# Patient Record
Sex: Male | Born: 1950 | Race: White | Hispanic: No | Marital: Married | Smoking: Former smoker
Health system: Southern US, Community
[De-identification: ages and names within clinical notes are randomized; demographics above are authoritative.]

## PROBLEM LIST (undated history)

## (undated) DIAGNOSIS — R42 Dizziness and giddiness: Secondary | ICD-10-CM

## (undated) DIAGNOSIS — J449 Chronic obstructive pulmonary disease, unspecified: Secondary | ICD-10-CM

## (undated) DIAGNOSIS — E039 Hypothyroidism, unspecified: Secondary | ICD-10-CM

## (undated) DIAGNOSIS — E079 Disorder of thyroid, unspecified: Secondary | ICD-10-CM

## (undated) DIAGNOSIS — I10 Essential (primary) hypertension: Secondary | ICD-10-CM

## (undated) DIAGNOSIS — R0600 Dyspnea, unspecified: Secondary | ICD-10-CM

## (undated) DIAGNOSIS — M1990 Unspecified osteoarthritis, unspecified site: Secondary | ICD-10-CM

## (undated) DIAGNOSIS — N189 Chronic kidney disease, unspecified: Secondary | ICD-10-CM

## (undated) HISTORY — DX: Disorder of thyroid, unspecified: E07.9

## (undated) HISTORY — DX: Dizziness and giddiness: R42

## (undated) HISTORY — PX: OTHER SURGICAL HISTORY: SHX169

---

## 2013-06-19 DIAGNOSIS — M5430 Sciatica, unspecified side: Secondary | ICD-10-CM | POA: Diagnosis not present

## 2013-06-19 DIAGNOSIS — M999 Biomechanical lesion, unspecified: Secondary | ICD-10-CM | POA: Diagnosis not present

## 2013-06-19 DIAGNOSIS — M47817 Spondylosis without myelopathy or radiculopathy, lumbosacral region: Secondary | ICD-10-CM | POA: Diagnosis not present

## 2013-06-23 DIAGNOSIS — M47817 Spondylosis without myelopathy or radiculopathy, lumbosacral region: Secondary | ICD-10-CM | POA: Diagnosis not present

## 2013-06-23 DIAGNOSIS — M999 Biomechanical lesion, unspecified: Secondary | ICD-10-CM | POA: Diagnosis not present

## 2013-06-23 DIAGNOSIS — M5430 Sciatica, unspecified side: Secondary | ICD-10-CM | POA: Diagnosis not present

## 2013-06-26 DIAGNOSIS — M999 Biomechanical lesion, unspecified: Secondary | ICD-10-CM | POA: Diagnosis not present

## 2013-06-26 DIAGNOSIS — M47817 Spondylosis without myelopathy or radiculopathy, lumbosacral region: Secondary | ICD-10-CM | POA: Diagnosis not present

## 2013-06-26 DIAGNOSIS — M5430 Sciatica, unspecified side: Secondary | ICD-10-CM | POA: Diagnosis not present

## 2013-06-30 DIAGNOSIS — M999 Biomechanical lesion, unspecified: Secondary | ICD-10-CM | POA: Diagnosis not present

## 2013-06-30 DIAGNOSIS — M5430 Sciatica, unspecified side: Secondary | ICD-10-CM | POA: Diagnosis not present

## 2013-06-30 DIAGNOSIS — M47817 Spondylosis without myelopathy or radiculopathy, lumbosacral region: Secondary | ICD-10-CM | POA: Diagnosis not present

## 2013-07-08 DIAGNOSIS — M999 Biomechanical lesion, unspecified: Secondary | ICD-10-CM | POA: Diagnosis not present

## 2013-07-08 DIAGNOSIS — M5430 Sciatica, unspecified side: Secondary | ICD-10-CM | POA: Diagnosis not present

## 2013-07-08 DIAGNOSIS — M47817 Spondylosis without myelopathy or radiculopathy, lumbosacral region: Secondary | ICD-10-CM | POA: Diagnosis not present

## 2013-07-15 DIAGNOSIS — M47817 Spondylosis without myelopathy or radiculopathy, lumbosacral region: Secondary | ICD-10-CM | POA: Diagnosis not present

## 2013-07-15 DIAGNOSIS — M999 Biomechanical lesion, unspecified: Secondary | ICD-10-CM | POA: Diagnosis not present

## 2013-07-15 DIAGNOSIS — M5430 Sciatica, unspecified side: Secondary | ICD-10-CM | POA: Diagnosis not present

## 2013-08-05 DIAGNOSIS — M999 Biomechanical lesion, unspecified: Secondary | ICD-10-CM | POA: Diagnosis not present

## 2013-08-05 DIAGNOSIS — M5430 Sciatica, unspecified side: Secondary | ICD-10-CM | POA: Diagnosis not present

## 2013-08-05 DIAGNOSIS — M47817 Spondylosis without myelopathy or radiculopathy, lumbosacral region: Secondary | ICD-10-CM | POA: Diagnosis not present

## 2013-09-17 DIAGNOSIS — M9981 Other biomechanical lesions of cervical region: Secondary | ICD-10-CM | POA: Diagnosis not present

## 2013-09-17 DIAGNOSIS — M999 Biomechanical lesion, unspecified: Secondary | ICD-10-CM | POA: Diagnosis not present

## 2013-09-17 DIAGNOSIS — M47817 Spondylosis without myelopathy or radiculopathy, lumbosacral region: Secondary | ICD-10-CM | POA: Diagnosis not present

## 2013-09-17 DIAGNOSIS — M4712 Other spondylosis with myelopathy, cervical region: Secondary | ICD-10-CM | POA: Diagnosis not present

## 2013-09-17 DIAGNOSIS — M546 Pain in thoracic spine: Secondary | ICD-10-CM | POA: Diagnosis not present

## 2013-10-15 DIAGNOSIS — E7800 Pure hypercholesterolemia, unspecified: Secondary | ICD-10-CM | POA: Diagnosis not present

## 2014-01-15 DIAGNOSIS — I10 Essential (primary) hypertension: Secondary | ICD-10-CM | POA: Diagnosis not present

## 2014-01-20 DIAGNOSIS — R739 Hyperglycemia, unspecified: Secondary | ICD-10-CM | POA: Diagnosis not present

## 2014-01-20 DIAGNOSIS — Z23 Encounter for immunization: Secondary | ICD-10-CM | POA: Diagnosis not present

## 2014-01-20 DIAGNOSIS — M1990 Unspecified osteoarthritis, unspecified site: Secondary | ICD-10-CM | POA: Diagnosis not present

## 2014-01-20 DIAGNOSIS — Z6827 Body mass index (BMI) 27.0-27.9, adult: Secondary | ICD-10-CM | POA: Diagnosis not present

## 2014-01-20 DIAGNOSIS — E780 Pure hypercholesterolemia: Secondary | ICD-10-CM | POA: Diagnosis not present

## 2014-01-20 DIAGNOSIS — I10 Essential (primary) hypertension: Secondary | ICD-10-CM | POA: Diagnosis not present

## 2014-07-15 DIAGNOSIS — E780 Pure hypercholesterolemia: Secondary | ICD-10-CM | POA: Diagnosis not present

## 2014-07-15 DIAGNOSIS — I10 Essential (primary) hypertension: Secondary | ICD-10-CM | POA: Diagnosis not present

## 2014-07-15 DIAGNOSIS — R739 Hyperglycemia, unspecified: Secondary | ICD-10-CM | POA: Diagnosis not present

## 2014-07-15 DIAGNOSIS — M1000 Idiopathic gout, unspecified site: Secondary | ICD-10-CM | POA: Diagnosis not present

## 2014-07-15 DIAGNOSIS — M1990 Unspecified osteoarthritis, unspecified site: Secondary | ICD-10-CM | POA: Diagnosis not present

## 2014-07-21 DIAGNOSIS — M1000 Idiopathic gout, unspecified site: Secondary | ICD-10-CM | POA: Diagnosis not present

## 2014-07-21 DIAGNOSIS — E780 Pure hypercholesterolemia: Secondary | ICD-10-CM | POA: Diagnosis not present

## 2014-07-21 DIAGNOSIS — I10 Essential (primary) hypertension: Secondary | ICD-10-CM | POA: Diagnosis not present

## 2014-10-23 DIAGNOSIS — M25562 Pain in left knee: Secondary | ICD-10-CM | POA: Diagnosis not present

## 2014-10-23 DIAGNOSIS — M25462 Effusion, left knee: Secondary | ICD-10-CM | POA: Diagnosis not present

## 2014-11-02 DIAGNOSIS — M25462 Effusion, left knee: Secondary | ICD-10-CM | POA: Diagnosis not present

## 2014-11-02 DIAGNOSIS — M25562 Pain in left knee: Secondary | ICD-10-CM | POA: Diagnosis not present

## 2014-11-30 DIAGNOSIS — M25562 Pain in left knee: Secondary | ICD-10-CM | POA: Diagnosis not present

## 2014-11-30 DIAGNOSIS — M25462 Effusion, left knee: Secondary | ICD-10-CM | POA: Diagnosis not present

## 2015-01-13 DIAGNOSIS — E780 Pure hypercholesterolemia: Secondary | ICD-10-CM | POA: Diagnosis not present

## 2015-01-13 DIAGNOSIS — I10 Essential (primary) hypertension: Secondary | ICD-10-CM | POA: Diagnosis not present

## 2015-01-13 DIAGNOSIS — R739 Hyperglycemia, unspecified: Secondary | ICD-10-CM | POA: Diagnosis not present

## 2015-01-18 DIAGNOSIS — M25562 Pain in left knee: Secondary | ICD-10-CM | POA: Diagnosis not present

## 2015-01-18 DIAGNOSIS — M25462 Effusion, left knee: Secondary | ICD-10-CM | POA: Diagnosis not present

## 2015-01-26 DIAGNOSIS — L821 Other seborrheic keratosis: Secondary | ICD-10-CM | POA: Diagnosis not present

## 2015-01-26 DIAGNOSIS — L570 Actinic keratosis: Secondary | ICD-10-CM | POA: Diagnosis not present

## 2015-02-19 DIAGNOSIS — M948X6 Other specified disorders of cartilage, lower leg: Secondary | ICD-10-CM | POA: Diagnosis not present

## 2015-02-19 DIAGNOSIS — M25462 Effusion, left knee: Secondary | ICD-10-CM | POA: Diagnosis not present

## 2015-02-19 DIAGNOSIS — S83282A Other tear of lateral meniscus, current injury, left knee, initial encounter: Secondary | ICD-10-CM | POA: Diagnosis not present

## 2015-02-19 DIAGNOSIS — M25562 Pain in left knee: Secondary | ICD-10-CM | POA: Diagnosis not present

## 2015-02-22 DIAGNOSIS — M25562 Pain in left knee: Secondary | ICD-10-CM | POA: Diagnosis not present

## 2015-05-26 DIAGNOSIS — E7800 Pure hypercholesterolemia, unspecified: Secondary | ICD-10-CM | POA: Diagnosis not present

## 2015-05-26 DIAGNOSIS — R739 Hyperglycemia, unspecified: Secondary | ICD-10-CM | POA: Diagnosis not present

## 2015-05-26 DIAGNOSIS — I10 Essential (primary) hypertension: Secondary | ICD-10-CM | POA: Diagnosis not present

## 2015-06-09 DIAGNOSIS — Z79899 Other long term (current) drug therapy: Secondary | ICD-10-CM | POA: Diagnosis not present

## 2015-06-09 DIAGNOSIS — E785 Hyperlipidemia, unspecified: Secondary | ICD-10-CM | POA: Diagnosis not present

## 2015-06-09 DIAGNOSIS — Z7982 Long term (current) use of aspirin: Secondary | ICD-10-CM | POA: Diagnosis not present

## 2015-06-09 DIAGNOSIS — M1712 Unilateral primary osteoarthritis, left knee: Secondary | ICD-10-CM | POA: Diagnosis not present

## 2015-06-09 DIAGNOSIS — I10 Essential (primary) hypertension: Secondary | ICD-10-CM | POA: Diagnosis not present

## 2015-06-09 DIAGNOSIS — M25562 Pain in left knee: Secondary | ICD-10-CM | POA: Diagnosis not present

## 2015-07-16 DIAGNOSIS — M1712 Unilateral primary osteoarthritis, left knee: Secondary | ICD-10-CM | POA: Diagnosis not present

## 2015-07-16 DIAGNOSIS — M25562 Pain in left knee: Secondary | ICD-10-CM | POA: Diagnosis not present

## 2015-11-11 DIAGNOSIS — E7800 Pure hypercholesterolemia, unspecified: Secondary | ICD-10-CM | POA: Diagnosis not present

## 2015-11-11 DIAGNOSIS — Z6827 Body mass index (BMI) 27.0-27.9, adult: Secondary | ICD-10-CM | POA: Diagnosis not present

## 2015-11-11 DIAGNOSIS — I10 Essential (primary) hypertension: Secondary | ICD-10-CM | POA: Diagnosis not present

## 2015-11-11 DIAGNOSIS — R0600 Dyspnea, unspecified: Secondary | ICD-10-CM | POA: Diagnosis not present

## 2015-12-02 DIAGNOSIS — R0600 Dyspnea, unspecified: Secondary | ICD-10-CM | POA: Diagnosis not present

## 2015-12-02 DIAGNOSIS — Z6826 Body mass index (BMI) 26.0-26.9, adult: Secondary | ICD-10-CM | POA: Diagnosis not present

## 2015-12-02 DIAGNOSIS — R079 Chest pain, unspecified: Secondary | ICD-10-CM | POA: Diagnosis not present

## 2015-12-02 DIAGNOSIS — I10 Essential (primary) hypertension: Secondary | ICD-10-CM | POA: Diagnosis not present

## 2015-12-07 DIAGNOSIS — R931 Abnormal findings on diagnostic imaging of heart and coronary circulation: Secondary | ICD-10-CM | POA: Diagnosis not present

## 2015-12-07 DIAGNOSIS — R0602 Shortness of breath: Secondary | ICD-10-CM | POA: Diagnosis not present

## 2015-12-07 DIAGNOSIS — R079 Chest pain, unspecified: Secondary | ICD-10-CM | POA: Diagnosis not present

## 2016-02-23 DIAGNOSIS — L821 Other seborrheic keratosis: Secondary | ICD-10-CM | POA: Diagnosis not present

## 2016-02-23 DIAGNOSIS — L570 Actinic keratosis: Secondary | ICD-10-CM | POA: Diagnosis not present

## 2016-02-23 DIAGNOSIS — D485 Neoplasm of uncertain behavior of skin: Secondary | ICD-10-CM | POA: Diagnosis not present

## 2016-03-03 DIAGNOSIS — N185 Chronic kidney disease, stage 5: Secondary | ICD-10-CM | POA: Diagnosis not present

## 2016-03-06 DIAGNOSIS — I10 Essential (primary) hypertension: Secondary | ICD-10-CM | POA: Diagnosis not present

## 2016-03-06 DIAGNOSIS — N183 Chronic kidney disease, stage 3 (moderate): Secondary | ICD-10-CM | POA: Diagnosis not present

## 2016-03-06 DIAGNOSIS — D631 Anemia in chronic kidney disease: Secondary | ICD-10-CM | POA: Diagnosis not present

## 2016-03-06 DIAGNOSIS — N2581 Secondary hyperparathyroidism of renal origin: Secondary | ICD-10-CM | POA: Diagnosis not present

## 2016-03-06 DIAGNOSIS — M109 Gout, unspecified: Secondary | ICD-10-CM | POA: Diagnosis not present

## 2016-03-14 DIAGNOSIS — N184 Chronic kidney disease, stage 4 (severe): Secondary | ICD-10-CM | POA: Diagnosis not present

## 2016-03-14 DIAGNOSIS — I10 Essential (primary) hypertension: Secondary | ICD-10-CM | POA: Diagnosis not present

## 2016-03-14 DIAGNOSIS — D631 Anemia in chronic kidney disease: Secondary | ICD-10-CM | POA: Diagnosis not present

## 2016-03-14 DIAGNOSIS — N2581 Secondary hyperparathyroidism of renal origin: Secondary | ICD-10-CM | POA: Diagnosis not present

## 2016-05-05 DIAGNOSIS — N2581 Secondary hyperparathyroidism of renal origin: Secondary | ICD-10-CM | POA: Diagnosis not present

## 2016-05-05 DIAGNOSIS — N184 Chronic kidney disease, stage 4 (severe): Secondary | ICD-10-CM | POA: Diagnosis not present

## 2016-05-12 DIAGNOSIS — I10 Essential (primary) hypertension: Secondary | ICD-10-CM | POA: Diagnosis not present

## 2016-05-12 DIAGNOSIS — H8113 Benign paroxysmal vertigo, bilateral: Secondary | ICD-10-CM | POA: Diagnosis not present

## 2016-05-12 DIAGNOSIS — D631 Anemia in chronic kidney disease: Secondary | ICD-10-CM | POA: Diagnosis not present

## 2016-05-12 DIAGNOSIS — N184 Chronic kidney disease, stage 4 (severe): Secondary | ICD-10-CM | POA: Diagnosis not present

## 2016-05-12 DIAGNOSIS — N2581 Secondary hyperparathyroidism of renal origin: Secondary | ICD-10-CM | POA: Diagnosis not present

## 2016-05-15 ENCOUNTER — Other Ambulatory Visit: Payer: Self-pay | Admitting: Nephrology

## 2016-05-15 DIAGNOSIS — N184 Chronic kidney disease, stage 4 (severe): Secondary | ICD-10-CM

## 2016-05-24 ENCOUNTER — Ambulatory Visit
Admission: RE | Admit: 2016-05-24 | Discharge: 2016-05-24 | Disposition: A | Discharge status: Home / Self Care | Payer: BLUE CROSS/BLUE SHIELD | Source: Ambulatory Visit | Attending: Nephrology | Admitting: Nephrology

## 2016-05-24 DIAGNOSIS — N184 Chronic kidney disease, stage 4 (severe): Secondary | ICD-10-CM

## 2016-05-24 DIAGNOSIS — I129 Hypertensive chronic kidney disease with stage 1 through stage 4 chronic kidney disease, or unspecified chronic kidney disease: Secondary | ICD-10-CM | POA: Diagnosis not present

## 2016-06-05 ENCOUNTER — Other Ambulatory Visit: Payer: Self-pay | Admitting: Nephrology

## 2016-06-05 DIAGNOSIS — I701 Atherosclerosis of renal artery: Secondary | ICD-10-CM

## 2016-06-08 ENCOUNTER — Ambulatory Visit (HOSPITAL_COMMUNITY): Payer: BLUE CROSS/BLUE SHIELD | Attending: Nephrology | Admitting: Physical Therapy

## 2016-06-08 DIAGNOSIS — H8111 Benign paroxysmal vertigo, right ear: Secondary | ICD-10-CM | POA: Insufficient documentation

## 2016-06-08 NOTE — Therapy (Addendum)
[place], [place], [address] Phone: [phone]   Fax:  [phone]  Physical Therapy Evaluation  Patient Details  Name: Marcus Herrera MRN: [id number] Date of Birth: 07/31/1950 Referring Provider: Elmarie Shiley   Encounter Date: 06/08/2016      PT End of Session - 06/08/16 1539    Visit Number 1   Number of Visits 8   Date for PT Re-Evaluation 07/08/16   Authorization Type BCBS   Authorization - Visit Number 1   Authorization - Number of Visits 8   PT Start Time 4268   PT Stop Time 3419   PT Time Calculation (min) 44 min   Activity Tolerance Patient tolerated treatment well   Behavior During Therapy [facility] for tasks assessed/performed      No past medical history on file.  No past surgical history on file.  There were no vitals filed for this visit.       Subjective Assessment - 06/08/16 1303    Subjective Marcus Herrera states that he ahd been experiencing episodes of dizziness whenever he looks up, as well as when he comes from sit to stand.  He states that the dizziness lasts from 2 to 30 minutes.      How long can you sit comfortably? no problem   How long can you stand comfortably? no problem    How long can you walk comfortably? no problem    Currently in Pain? No/denies            [facility] PT Assessment - 06/08/16 0001      Assessment   Medical Diagnosis BPPV   Referring Provider Elmarie Shiley    Onset Date/Surgical Date 11/30/15   Next MD Visit not scheduled    Prior Therapy none     Precautions   Precautions None     Restrictions   Weight Bearing Restrictions No     Balance Screen   Has the patient fallen in the past 6 months No   Has the patient had a decrease in activity level because of a fear of falling?  No   Is the patient reluctant to leave their home because of a fear of falling?  No     Prior Function   Level of Independence Independent     Cognition   Overall Cognitive Status  Within Functional Limits for tasks assessed            Vestibular Assessment - 06/08/16 0001      Vestibular Assessment   General Observation normal     Symptom Behavior   Type of Dizziness Imbalance   Frequency of Dizziness 5-10    Duration of Dizziness 2-30 minutes    Aggravating Factors Lying supine;Sit to stand   Relieving Factors Head stationary     Occulomotor Exam   Smooth Pursuits Saccades  Lt eye to the right strong; Left mild    Saccades Poor trajectory  to the left      Vestibulo-Occular Reflex   VOR 1 Head Only (x 1 viewing) negative    VOR 2 Head and Object (x 2 viewing) --  mild symptoms      Positional Testing   Dix-Hallpike Dix-Hallpike Right;Dix-Hallpike Left     Dix-Hallpike Right   Dix-Hallpike Right Duration --  20 seconds   Dix-Hallpike Right Symptoms Left nystagmus     Dix-Hallpike Left   Dix-Hallpike Left Duration --  mild symptoms    Dix-Hallpike Left Symptoms  No nystagmus                Vestibular Treatment/Exercise - 06/08/16 0001      Vestibular Treatment/Exercise   Vestibular Treatment Provided Canalith Repositioning;Gaze   Canalith Repositioning Epley Manuever Right   Gaze Exercises X1 Viewing Horizontal;X1 Viewing Vertical;X2 Viewing Horizontal;X2 Viewing Vertical      EPLEY MANUEVER RIGHT   Number of Reps  2   Overall Response Improved Symptoms   Response Details  less vertigo with second manuever      X1 Viewing Horizontal   Foot Position floor   Reps 5     X1 Viewing Vertical   Reps 5     X2 Viewing Horizontal   Reps 5     X2 Viewing Vertical   Reps 5               PT Education - 06/08/16 1539    Education provided Yes   Education Details eye exercises and VORI    Person(s) Educated Patient   Methods Explanation;Handout   Comprehension Verbalized understanding          PT Short Term Goals - 06/08/16 1545      PT SHORT TERM GOAL #1   Title Pt to verbalize that he is experiencing his  symptoms 5x a day or less to decrease risk of fall    Time 2   Period Weeks   Status New     PT SHORT TERM GOAL #2   Title Pt to states that he is able to come sit to stand without having any symptoms of dizziness.    Time 2   Period Weeks   Status New           PT Long Term Goals - 06/08/16 1547      PT LONG TERM GOAL #1   Title Pt to state that he has not had symptoms of dizziness for the past week    Time 4   Period Weeks   Status New     PT LONG TERM GOAL #2   Title PT to be able to look up at items at [facility] without experiencing any dizziness    Time 4   Period Weeks   Status New               Plan - 06/08/16 1540    Clinical Impression Statement Marcus Herrera is a 66 yo male who has been referred to skilled physical therapy for BPPV.  He states that his symptoms began six months ago and are progressive in nature.  PT has a positive Rt Hal-pike Dix test, as well as positve saccades.  Marcus Herrera will benefit from [facility], as well as habituation and high balance actitvity to decrease his sx of vertigo to decrease his risk of falling.    Rehab Potential Good   PT Frequency 2x / week   PT Duration 4 weeks   PT Treatment/Interventions ADLs/Self Care Home Management;Canalith Repostioning;Therapeutic exercise;Balance training;Patient/family education   PT Next Visit Plan give pt self cannalith repositioning sheet.  Complete eye motion and head turns in front of mural.  NBOS with head turns while on foam and repeat Eply's manuever .    PT Home Exercise Plan eye exercises, head turns    Consulted and Agree with Plan of Care Patient      [phone] CJ  Patient will benefit from skilled therapeutic intervention in order to improve the following deficits and  impairments:  Dizziness  Visit Diagnosis: BPPV (benign paroxysmal positional vertigo), right - Plan: PT plan of care cert/re-cert     Problem List There are no active problems to display for this  patient.   Rayetta Humphrey, PT CLT [phone] 06/08/2016, 3:52 PM  [place] [address] [place], [place], [address] Phone: [phone]   Fax:  [phone]  Name: Marcus Herrera MRN: [id number] Date of Birth: 1951/03/26

## 2016-06-13 ENCOUNTER — Ambulatory Visit (HOSPITAL_COMMUNITY): Payer: BLUE CROSS/BLUE SHIELD

## 2016-06-13 DIAGNOSIS — H8111 Benign paroxysmal vertigo, right ear: Secondary | ICD-10-CM | POA: Diagnosis not present

## 2016-06-13 NOTE — Therapy (Signed)
[place], [place], [address] Phone: [phone]   Fax:  [phone]  Physical Therapy Treatment  Patient Details  Name: Kenric Ginger MRN: [id number] Date of Birth: [birth year] Referring Provider: Elmarie Shiley   Encounter Date: 06/13/2016      PT End of Session - 06/13/16 0956    Visit Number 2   Number of Visits 8   Date for PT Re-Evaluation 07/08/16   Authorization Type BCBS   Authorization - Visit Number 2   Authorization - Number of Visits 8   PT Start Time [phone]   PT Stop Time 1030   PT Time Calculation (min) 42 min   Activity Tolerance Patient tolerated treatment well   Behavior During Therapy [facility] for tasks assessed/performed      No past medical history on file.  No past surgical history on file.  There were no vitals filed for this visit.      Subjective Assessment - 06/13/16 0939    Subjective Pt stated he had a dizzy episode at the grocery store following last session and some dizziness as he looks up.  Reports complaince with HEP without questions concerning exercises.   Currently in Pain? No/denies                Vestibular Assessment - 06/13/16 0001      Vestibular Assessment   General Observation normal     Occulomotor Exam   Smooth Pursuits Saccades   Saccades Poor trajectory     Positional Testing   Dix-Hallpike Dix-Hallpike Right  2 sets     Dix-Hallpike Right   Dix-Hallpike Right Symptoms Left nystagmus                  Vestibular Treatment/Exercise - 06/13/16 0001      Vestibular Treatment/Exercise   Vestibular Treatment Provided Canalith Repositioning;Gaze   Canalith Repositioning Epley Manuever Right      EPLEY MANUEVER RIGHT   Number of Reps  2   Overall Response Improved Symptoms   Response Details  less vertigo with second manuever      X1 Viewing Horizontal   Foot Position NBOS on foam   Reps 10     X1 Viewing Vertical   Foot Position  NBOS on foam   Reps 10   Comments Increased dizziness with looking up, increased dizziness with reps     X2 Viewing Horizontal   Foot Position NBOS on foam   Reps 10     X2 Viewing Vertical   Foot Position NBOS on foam   Reps 10   Comments Increased dizziness with looking up, increased dizziness with reps                 PT Short Term Goals - 06/08/16 1545      PT SHORT TERM GOAL #1   Title Pt to verbalize that he is experiencing his symptoms 5x a day or less to decrease risk of fall    Time 2   Period Weeks   Status New     PT SHORT TERM GOAL #2   Title Pt to states that he is able to come sit to stand without having any symptoms of dizziness.    Time 2   Period Weeks   Status New           PT Long Term Goals - 06/08/16 1547      PT LONG TERM GOAL #1   Title  Pt to state that he has not had symptoms of dizziness for the past week    Time 4   Period Weeks   Status New     PT LONG TERM GOAL #2   Title PT to be able to look up at items at [facility] without experiencing any dizziness    Time 4   Period Weeks   Status New               Plan - 06/13/16 1256    Clinical Impression Statement Reviewed goals, assured complaince and appropriate form/technique with HEP and copy of eval given to pt.  Began session with Eply's manever with reports of reduced dizziness intensity following.  Continued with eye gaze therex and progressed to NBOS balance activities on dynamic surface with min A required for safety on dynamic surface.     Rehab Potential Good   PT Frequency 2x / week   PT Duration 4 weeks   PT Treatment/Interventions ADLs/Self Care Home Management;Canalith Repostioning;Therapeutic exercise;Balance training;Patient/family education   PT Next Visit Plan give pt self cannalith repositioning sheet.  Complete eye motion and head turns in front of mural.  NBOS with head turns while on foam and repeat Eply's manuever .       Patient will benefit from  skilled therapeutic intervention in order to improve the following deficits and impairments:  Dizziness  Visit Diagnosis: BPPV (benign paroxysmal positional vertigo), right     Problem List There are no active problems to display for this patient.  [address], LPTA; [place]  Aldona Lento 06/13/2016, 1:07 PM  [place] [address] [place], [place], [address] Phone: [phone]   Fax:  [phone]  Name: Tajon Moring MRN: [id number] Date of Birth: 03/11/1951

## 2016-06-14 ENCOUNTER — Other Ambulatory Visit (HOSPITAL_COMMUNITY): Payer: Self-pay | Admitting: Interventional Radiology

## 2016-06-14 ENCOUNTER — Ambulatory Visit
Admission: RE | Admit: 2016-06-14 | Discharge: 2016-06-14 | Disposition: A | Discharge status: Home / Self Care | Payer: BLUE CROSS/BLUE SHIELD | Source: Ambulatory Visit | Attending: Nephrology | Admitting: Nephrology

## 2016-06-14 DIAGNOSIS — I701 Atherosclerosis of renal artery: Secondary | ICD-10-CM | POA: Diagnosis not present

## 2016-06-14 HISTORY — DX: Chronic obstructive pulmonary disease, unspecified: J44.9

## 2016-06-14 HISTORY — DX: Essential (primary) hypertension: I10

## 2016-06-14 HISTORY — DX: Chronic kidney disease, unspecified: N18.9

## 2016-06-14 HISTORY — PX: IR GENERIC HISTORICAL: IMG1180011

## 2016-06-14 NOTE — Consult Note (Signed)
Chief Complaint: Patient was seen in consultation today for renal arteriography at the request of Patel,Jay  Referring Physician(s): Patel,Jay  History of Present Illness: Marcus Herrera is a 66 y.o. male with a history of chronic kidney disease, stage IV. He had a progressive decline in renal function in 2017 with creatinine as high as 3.1. Creatinine is now around 2.4. Renal ultrasound demonstrated a smaller right kidney compared to the left. Additional renal duplex ultrasound on 05/24/2016 demonstrated elevated velocities at the level of the proximal to mid left renal artery. Although renal artery ratio at this level was not higher than 3.5, there was significant discrepancy in velocity measurements between the right and left sides.  [name] is currently undergoing rehabilitation treatment for benign positional vertigo.  Past Medical History:  Diagnosis Date  . Chronic kidney disease   . COPD (chronic obstructive pulmonary disease) ([place])   . Hypertension     No past surgical history on file.  Allergies: Sulfa antibiotics  Medications: Prior to Admission medications   Medication Sig Start Date End Date Taking? Authorizing Provider  aspirin 81 MG tablet Take 81 mg by mouth daily.   Yes Historical Provider, MD  atorvastatin (LIPITOR) 10 MG tablet Take 10 mg by mouth daily.   Yes Historical Provider, MD  cholecalciferol (VITAMIN D) 1000 units tablet Take 1,000 Units by mouth daily.   Yes Historical Provider, MD  doxycycline (ADOXA) 50 MG tablet Take 50 mg by mouth daily.   Yes Historical Provider, MD  gemfibrozil (LOPID) 600 MG tablet Take 600 mg by mouth daily.   Yes Historical Provider, MD  losartan-hydrochlorothiazide (HYZAAR) 100-12.5 MG tablet Take 1 tablet by mouth daily. Take 1/2 tablet per day.   Yes Historical Provider, MD  Omega-3 Fatty Acids (FISH OIL PO) Take 1 capsule by mouth daily.   Yes Historical Provider, MD     No family history on file.  Social History    Social History  . Marital status: Married    Spouse name: N/A  . Number of children: N/A  . Years of education: N/A   Social History Main Topics  . Smoking status: Former Research scientist (life sciences)  . Smokeless tobacco: Never Used  . Alcohol use 4.2 - 6.0 oz/week    7 - 10 Cans of beer per week  . Drug use: No  . Sexual activity: Not on file   Other Topics Concern  . Not on file   Social History Narrative  . No narrative on file     Review of Systems: A 12 point ROS discussed and pertinent positives are indicated in the HPI above.  All other systems are negative.  Review of Systems  Constitutional: Negative.   HENT: Negative.   Respiratory: Negative.   Cardiovascular: Negative.   Gastrointestinal: Negative.   Genitourinary: Negative.   Musculoskeletal: Negative.   Neurological:       Vertigo when looking up for prolonged periods of time.    Vital Signs: BP 137/61 (BP Location: Left Arm, Patient Position: Sitting, Cuff Size: Normal)   Pulse 70   Temp 97.8 F (36.6 C) (Oral)   Resp 14   Ht 6' (1.829 m)   Wt 195 lb (88.5 kg)   SpO2 100%   BMI 26.45 kg/m   Physical Exam  Constitutional: He is oriented to person, place, and time. He appears well-developed and well-nourished. No distress.  HENT:  Head: Normocephalic and atraumatic.  Neck: Neck supple. No JVD present.  Cardiovascular: Normal rate,  regular rhythm and normal heart sounds.  Exam reveals no gallop and no friction rub.   No murmur heard. Pulmonary/Chest: Effort normal and breath sounds normal. No respiratory distress. He has no wheezes. He has no rales.  Abdominal: Soft. Bowel sounds are normal. He exhibits no distension and no mass. There is no tenderness. There is no rebound and no guarding.  No audible bruits.  Musculoskeletal: He exhibits no edema.  Neurological: He is alert and oriented to person, place, and time.  Skin: He is not diaphoretic.  Nursing note and vitals reviewed.   Imaging: US Renal  Result  Date: 05/24/2016 CLINICAL DATA:  Hypertension, chronic kidney disease stage 4 EXAM: RENAL/URINARY TRACT ULTRASOUND RENAL DUPLEX DOPPLER ULTRASOUND COMPARISON:  None. FINDINGS: Right Kidney: Length: 8 cm. Echogenicity within normal limits. 3 mm shadowing focus in the mid renal collecting system. No mass or hydronephrosis visualized. Left Kidney: Length: 11.2 cm. Echogenicity within normal limits. No mass or hydronephrosis visualized. Bladder:  Unremarkable. Prostatic enlargement 4.6 x 3.4 x 5 cm. RENAL DUPLEX ULTRASOUND Right Renal Artery Velocities: Origin:  70 cm/sec Mid:  100 cm/sec Hilum:  51 cm/sec Interlobar:  25 cm/sec Arcuate:  15 Cm/sec Left Renal Artery Velocities: Origin:  226 cm/sec Mid:  280 cm/sec Hilum:  136 cm/sec Interlobar:  50 cm/sec Arcuate:  26 cm/sec Aortic Velocity:  112 Cm/sec Right Renal-Aortic Ratios: Origin: 0.6 Mid:  0.9 Hilum: 0.5 Interlobar: 0.2 Arcuate: 0.1 Left Renal-Aortic Ratios: Origin: 2.0 Mid: 2.5 Hilum: 1.2 Interlobar: 0.4 Arcuate: 0.2 IMPRESSION: 1. Elevated peak systolic velocities in the proximal left renal artery suggesting hemodynamically significant stenosis. Consider catheter angiography for further evaluation, as the lesion may be amenable to percutaneous treatment if confirmed to be significant. 2. Right renal parenchymal atrophy. Electronically Signed   By: Lucrezia Europe M.D.   On: 05/24/2016 10:45   US Renal Artery Stenosis  Result Date: 05/24/2016 CLINICAL DATA:  Hypertension, chronic kidney disease stage 4 EXAM: RENAL/URINARY TRACT ULTRASOUND RENAL DUPLEX DOPPLER ULTRASOUND COMPARISON:  None. FINDINGS: Right Kidney: Length: 8 cm. Echogenicity within normal limits. 3 mm shadowing focus in the mid renal collecting system. No mass or hydronephrosis visualized. Left Kidney: Length: 11.2 cm. Echogenicity within normal limits. No mass or hydronephrosis visualized. Bladder:  Unremarkable. Prostatic enlargement 4.6 x 3.4 x 5 cm. RENAL DUPLEX ULTRASOUND Right Renal Artery  Velocities: Origin:  70 cm/sec Mid:  100 cm/sec Hilum:  51 cm/sec Interlobar:  25 cm/sec Arcuate:  15 Cm/sec Left Renal Artery Velocities: Origin:  226 cm/sec Mid:  280 cm/sec Hilum:  136 cm/sec Interlobar:  50 cm/sec Arcuate:  26 cm/sec Aortic Velocity:  112 Cm/sec Right Renal-Aortic Ratios: Origin: 0.6 Mid:  0.9 Hilum: 0.5 Interlobar: 0.2 Arcuate: 0.1 Left Renal-Aortic Ratios: Origin: 2.0 Mid: 2.5 Hilum: 1.2 Interlobar: 0.4 Arcuate: 0.2 IMPRESSION: 1. Elevated peak systolic velocities in the proximal left renal artery suggesting hemodynamically significant stenosis. Consider catheter angiography for further evaluation, as the lesion may be amenable to percutaneous treatment if confirmed to be significant. 2. Right renal parenchymal atrophy. Electronically Signed   By: Lucrezia Europe M.D.   On: 05/24/2016 10:45    Assessment and Plan:  I met with Mr. Guerreiro and discussed performing renal arteriography with possible intervention due to the duplex ultrasound findings suggestive of potential underlying left renal artery stenosis. We reviewed duplex ultrasound findings. 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  This may be at the level of the renal parenchyma, however, and not the main renal artery segment. 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 If this is uncovered with CO2 arteriography and determined to be hemodynamically significant, this would be potentially treatable with placement of an intravascular stent. I told Mr. Moultrie that treatment of a significant main renal artery segment stenosis may or may not have an impact on progression of chronic kidney disease. However, there may still be some long-term benefit in reducing the risk of progressive atrophy of the left kidney if a significant left renal artery  stenosis is uncovered by arteriography. I did make him aware that renal function could transiently worsened after stent placement due to microembolization to intraparenchymal branches after stent placement due to plaque rupture.  Details and risks of arteriography and possible stent placement were discussed with the patient. After discussion, the patient would like to proceed. We will schedule the procedure at [facility] and tentatively it appears that the procedure will be scheduled on Monday, March 12.  Thank you for this interesting consult.  I greatly enjoyed meeting [facility] and look forward to participating in their care.  A copy of this report was sent to the requesting provider on this date.  Electronically SignedAletta Edouard T 06/14/2016, 9:47 AM   I spent a total of 40 Minutes in face to face in clinical consultation, greater than 50% of which was counseling/coordinating care for renal arteriography and possible stent placement to treat renal artery stenosis.

## 2016-06-15 ENCOUNTER — Ambulatory Visit (HOSPITAL_COMMUNITY): Payer: BLUE CROSS/BLUE SHIELD | Attending: Nephrology

## 2016-06-15 DIAGNOSIS — H8111 Benign paroxysmal vertigo, right ear: Secondary | ICD-10-CM | POA: Insufficient documentation

## 2016-06-15 NOTE — Therapy (Signed)
[place], [place], [address] Phone: [phone]   Fax:  [phone]  Physical Therapy Treatment  Patient Details  Name: Tramayne Sebesta MRN: [id number] Date of Birth: [birth year] Referring Provider: Elmarie Shiley   Encounter Date: 06/15/2016      PT End of Session - 06/15/16 1129    Visit Number 3   Number of Visits 8   Date for PT Re-Evaluation 07/08/16   Authorization Type BCBS   Authorization - Visit Number 3   Authorization - Number of Visits 8   PT Start Time 1119   PT Stop Time 6606   PT Time Calculation (min) 38 min   Equipment Utilized During Treatment Gait belt   Activity Tolerance Patient tolerated treatment well;No increased pain   Behavior During Therapy WFL for tasks assessed/performed      Past Medical History:  Diagnosis Date  . Chronic kidney disease   . COPD (chronic obstructive pulmonary disease) ([place])   . Hypertension     No past surgical history on file.  There were no vitals filed for this visit.      Subjective Assessment - 06/15/16 1122    Subjective Pt stated he has had less dizzy episodes but has not looked up as much lately.  Reports compliance wiht HEP 2x daily.     Currently in Pain? No/denies                          Vestibular Treatment/Exercise - 06/15/16 0001      Vestibular Treatment/Exercise   Vestibular Treatment Provided Canalith Repositioning;Gaze   Canalith Repositioning Epley Manuever Right   Gaze Exercises X2 Viewing Horizontal;X2 Viewing Vertical;Eye/Head Exercise Vertical      EPLEY MANUEVER RIGHT   Number of Reps  3  last epley's manuever self    Overall Response Improved Symptoms   Response Details  less vertigo with second manuever             Balance Exercises - 06/15/16 1203      Balance Exercises: Standing   Standing Eyes Opened Narrow base of support (BOS);Head turns  eye gaze and head movements on foam NBOS 10x each  direction   Tandem Stance Eyes open;2 reps;30 secs  with eye gaze activities             PT Short Term Goals - 06/08/16 1545      PT SHORT TERM GOAL #1   Title Pt to verbalize that he is experiencing his symptoms 5x a day or less to decrease risk of fall    Time 2   Period Weeks   Status New     PT SHORT TERM GOAL #2   Title Pt to states that he is able to come sit to stand without having any symptoms of dizziness.    Time 2   Period Weeks   Status New           PT Long Term Goals - 06/08/16 1547      PT LONG TERM GOAL #1   Title Pt to state that he has not had symptoms of dizziness for the past week    Time 4   Period Weeks   Status New     PT LONG TERM GOAL #2   Title PT to be able to look up at items at [facility] without experiencing any dizziness    Time 4   Period Weeks  Status New               Plan - 06/15/16 1204    Clinical Impression Statement Began session with Epley's manuever, instructed and pt given handout for self cannalith repositioning sheet.  Pt able to verbalize and demonstrate appropriate form and technqiue with no questions pertaining.  Reports of decreased dizzines with each set.  Ended session with NBOS balance activities with min A required for safety to reduce risk of fall.     Rehab Potential Good   PT Frequency 2x / week   PT Duration 4 weeks   PT Next Visit Plan Next session review compliance and answer any questions iwht self cannalith repositioning. Complete eye motion and head turns in front of mural.  NBOS with head turns while on foam and repeat Eply's manuever .    PT Home Exercise Plan eye exercises, head turns       Patient will benefit from skilled therapeutic intervention in order to improve the following deficits and impairments:  Dizziness  Visit Diagnosis: BPPV (benign paroxysmal positional vertigo), right     Problem List There are no active problems to display for this patient.  [address], LPTA;  [place]  Aldona Lento 06/15/2016, 12:09 PM  [place] [address] [place], [place], [address] Phone: [phone]   Fax:  [phone]  Name: Makena Mcgrady MRN: [id number] Date of Birth: 05-Mar-1951

## 2016-06-20 ENCOUNTER — Ambulatory Visit (HOSPITAL_COMMUNITY): Payer: BLUE CROSS/BLUE SHIELD

## 2016-06-20 DIAGNOSIS — H8111 Benign paroxysmal vertigo, right ear: Secondary | ICD-10-CM

## 2016-06-20 NOTE — Therapy (Signed)
[place], [place], [address] Phone: [phone]   Fax:  [phone]  Physical Therapy Treatment  Patient Details  Name: Marcus Herrera MRN: [id number] Date of Birth: [birth year] Referring Provider: Elmarie Shiley   Encounter Date: 06/20/2016      PT End of Session - 06/20/16 0820    Visit Number 4   Number of Visits 8   Date for PT Re-Evaluation 07/08/16   Authorization Type BCBS   PT Start Time 0816   PT Stop Time 0858   PT Time Calculation (min) 42 min   Equipment Utilized During Treatment Gait belt   Activity Tolerance Patient tolerated treatment well;No increased pain   Behavior During Therapy WFL for tasks assessed/performed      Past Medical History:  Diagnosis Date  . Chronic kidney disease   . COPD (chronic obstructive pulmonary disease) ([place])   . Hypertension     No past surgical history on file.  There were no vitals filed for this visit.      Subjective Assessment - 06/20/16 0818    Subjective Pt reports compliance with self tx and reports decreased of overall dizzyness.  Reports difficulty with balance.   Currently in Pain? No/denies                          Vestibular Treatment/Exercise - 06/20/16 0001      X2 Viewing Horizontal   Foot Position NBOS on foam   Reps 10     X2 Viewing Vertical   Foot Position NBOS on foam   Reps 10   Comments Increased dizziness with looking up, increased dizziness with reps     Eye/Head Exercise Vertical   Foot Position NBOS on airex   Reps 10            Balance Exercises - 06/20/16 0834      Balance Exercises: Standing   Standing Eyes Opened Narrow base of support (BOS);Head turns   Tandem Stance Eyes open;Foam/compliant surface;3 reps;30 secs  busy room   SLS 5 reps   SLS with Vectors Solid surface;3 reps;Intermittent upper extremity assist  3x5" with intermittent HHA   Balance Beam tandem 3 RT; 2 sets iwth head turns    Gait with Head Turns Forward;2 reps  2 RT around dept with looking up/down and rotation   Tandem Gait --  tandem 3 RT; 2 sets iwth head turns   Numbers 1-15 Foam/compliant surface;1 rep  too easy, D/C further sessions             PT Short Term Goals - 06/08/16 1545      PT SHORT TERM GOAL #1   Title Pt to verbalize that he is experiencing his symptoms 5x a day or less to decrease risk of fall    Time 2   Period Weeks   Status New     PT SHORT TERM GOAL #2   Title Pt to states that he is able to come sit to stand without having any symptoms of dizziness.    Time 2   Period Weeks   Status New           PT Long Term Goals - 06/08/16 1547      PT LONG TERM GOAL #1   Title Pt to state that he has not had symptoms of dizziness for the past week    Time 4   Period Weeks  Status New     PT LONG TERM GOAL #2   Title PT to be able to look up at items at [facility] without experiencing any dizziness    Time 4   Period Weeks   Status New               Plan - 06/20/16 5465    Clinical Impression Statement Pt reports independence with self cannalith repositioning at home and compliance with gazing exercises.  Session focus on balance activities as pt arrived without symptoms today and reoprts overall reduction wiht vertigo.  Progressed balance activities with gaze exercises infront of mural on dynamic surface in static position initially then incorporated with gait activities.  Min A required for LOB episodes for safety.  Most difficulty noted with static tandem stance.   Rehab Potential Good   PT Frequency 2x / week   PT Duration 4 weeks   PT Treatment/Interventions ADLs/Self Care Home Management;Canalith Repostioning;Therapeutic exercise;Balance training;Patient/family education   PT Next Visit Plan Continue session focus on balance activities with dynamic surface in front of mural.  Next session begin cone rotation in tandem stance on foam.        Patient will  benefit from skilled therapeutic intervention in order to improve the following deficits and impairments:  Dizziness  Visit Diagnosis: BPPV (benign paroxysmal positional vertigo), right     Problem List There are no active problems to display for this patient.  [address] LPTA; [place]  Aldona Lento 06/20/2016, 9:48 AM  [place], [place], [address] Phone: [phone]   Fax:  [phone]  Name: Garrie Woodin MRN: [id number] Date of Birth: 1950/08/03

## 2016-06-20 NOTE — Patient Instructions (Signed)
SINGLE LIMB STANCE    Stance: single leg on floor. Raise leg. Hold 30-60 seconds. Repeat with other leg. 5 reps per set,  Copyright  VHI. All rights reserved.   Tandem Stance    Right foot in front of left, heel touching toe both feet "straight ahead". Stand on Foot Triangle of Support with both feet. Balance in this position 30 seconds. Do with left foot in front of right.  Copyright  VHI. All rights reserved.

## 2016-06-23 ENCOUNTER — Other Ambulatory Visit: Payer: Self-pay | Admitting: General Surgery

## 2016-06-23 ENCOUNTER — Other Ambulatory Visit: Payer: Self-pay | Admitting: Physician Assistant

## 2016-06-23 ENCOUNTER — Encounter: Payer: Self-pay | Admitting: Interventional Radiology

## 2016-06-23 ENCOUNTER — Ambulatory Visit (HOSPITAL_COMMUNITY): Payer: BLUE CROSS/BLUE SHIELD

## 2016-06-23 DIAGNOSIS — H8111 Benign paroxysmal vertigo, right ear: Secondary | ICD-10-CM | POA: Diagnosis not present

## 2016-06-23 NOTE — Therapy (Signed)
[place] [address] [place], [place], [address] Phone: [phone]   Fax:  [phone]  Physical Therapy Treatment  Patient Details  Name: Marcus Herrera MRN: [id number] Date of Birth: December 06, 1950 Referring Provider: Elmarie Shiley   Encounter Date: 06/23/2016      PT End of Session - 06/23/16 0915    Visit Number 5   Number of Visits 8   Date for PT Re-Evaluation 07/08/16   Authorization Type BCBS   Authorization - Visit Number 5   Authorization - Number of Visits 8   PT Start Time 0816   PT Stop Time 0903   PT Time Calculation (min) 47 min   Equipment Utilized During Treatment Gait belt   Activity Tolerance Patient tolerated treatment well  c/o Lt hip catching following tandem stance on airex   Behavior During Therapy [facility] for tasks assessed/performed      Past Medical History:  Diagnosis Date  . Chronic kidney disease   . COPD (chronic obstructive pulmonary disease) ([place])   . Hypertension     Past Surgical History:  Procedure Laterality Date  . IR GENERIC HISTORICAL  06/14/2016   IR RADIOLOGIST EVAL & MGMT 06/14/2016 Aletta Edouard, MD [facility] INTERV RAD    There were no vitals filed for this visit.      Subjective Assessment - 06/23/16 0814    Subjective Pt reports complaince wtih self tx and reportsdecrease in overall dizziness.  Stated increased ease with balance activiteis at home.     Currently in Pain? No/denies              Balance Exercises - 06/23/16 0820      Balance Exercises: Standing   Tandem Stance Eyes open;Foam/compliant surface;3 reps;30 secs  last set with head turns   SLS 5 reps  Lt 11", Rt 51"   SLS with Vectors Solid surface;3 reps;Intermittent upper extremity assist;10 secs   Rebounder Foam/compliant surface;Tandem;20 reps   Balance Master: Limits for Stability Test: 3:46   Balance Master: Dynamic Test stability index 7.1; A34%, B55%, C 11%, D 0   Balance Beam tandem and retro 2RT   Gait with Head Turns Forward;2 reps  DGI   Sidestepping 2 reps;Theraband;Head turns  BTB   Cone Rotation Foam/compliant surface;A/P;R/L  tandem stance on Airex             PT Short Term Goals - 06/08/16 1545      PT SHORT TERM GOAL #1   Title Pt to verbalize that he is experiencing his symptoms 5x a day or less to decrease risk of fall    Time 2   Period Weeks   Status New     PT SHORT TERM GOAL #2   Title Pt to states that he is able to come sit to stand without having any symptoms of dizziness.    Time 2   Period Weeks   Status New           PT Long Term Goals - 06/08/16 1547      PT LONG TERM GOAL #1   Title Pt to state that he has not had symptoms of dizziness for the past week    Time 4   Period Weeks   Status New     PT LONG TERM GOAL #2   Title PT to be able to look up at items at [facility] without experiencing any dizziness    Time 4   Period Weeks  Status New               Plan - 06/23/16 4562    Clinical Impression Statement Pt progressing well with reports of decreased vertigo and compliance with self cannalith repositioning at home as well as balance HEP.  Continued session focus on balance activities with increased focus on static position on solid and dynamic surface.  Pt progressing well with min guard required only, does continue to have difficulty with Lt SLS activities.  Added sidestepping with resistance for glut med strengthening, began cone rotation in tandem on foam and testing complete on balance master.  Pt did c/o some Lt hip catching during balance activties, no pain scale given and reports ability to tolerate session.     Rehab Potential Good   PT Frequency 2x / week   PT Duration 4 weeks   PT Treatment/Interventions ADLs/Self Care Home Management;Canalith Repostioning;Therapeutic exercise;Balance training;Patient/family education   PT Next Visit Plan Continue SLS activities, tandem stance on foam with head/UE movements, begin  hurdles next session with SLS hold times.      Patient will benefit from skilled therapeutic intervention in order to improve the following deficits and impairments:  Dizziness  Visit Diagnosis: BPPV (benign paroxysmal positional vertigo), right     Problem List There are no active problems to display for this patient.  [address], LPTA; [place] Aldona Lento 06/23/2016, 9:28 AM  [place] [address] [place], [place], [address] Phone: [phone]   Fax:  [phone]  Name: Burley Kopka MRN: [id number] Date of Birth: [birth year]

## 2016-06-25 ENCOUNTER — Other Ambulatory Visit: Payer: Self-pay | Admitting: Radiology

## 2016-06-26 ENCOUNTER — Observation Stay (HOSPITAL_COMMUNITY)
Admission: RE | Admit: 2016-06-26 | Discharge: 2016-06-27 | Disposition: A | Discharge status: Home / Self Care | Payer: BLUE CROSS/BLUE SHIELD | Source: Ambulatory Visit | Attending: Interventional Radiology | Admitting: Interventional Radiology

## 2016-06-26 ENCOUNTER — Encounter (HOSPITAL_COMMUNITY): Payer: Self-pay | Admitting: Interventional Radiology

## 2016-06-26 ENCOUNTER — Other Ambulatory Visit (HOSPITAL_COMMUNITY): Payer: Self-pay | Admitting: Interventional Radiology

## 2016-06-26 DIAGNOSIS — R55 Syncope and collapse: Principal | ICD-10-CM | POA: Insufficient documentation

## 2016-06-26 DIAGNOSIS — J449 Chronic obstructive pulmonary disease, unspecified: Secondary | ICD-10-CM | POA: Diagnosis not present

## 2016-06-26 DIAGNOSIS — Z7982 Long term (current) use of aspirin: Secondary | ICD-10-CM | POA: Diagnosis not present

## 2016-06-26 DIAGNOSIS — I701 Atherosclerosis of renal artery: Secondary | ICD-10-CM | POA: Diagnosis not present

## 2016-06-26 DIAGNOSIS — Z882 Allergy status to sulfonamides status: Secondary | ICD-10-CM | POA: Diagnosis not present

## 2016-06-26 DIAGNOSIS — I129 Hypertensive chronic kidney disease with stage 1 through stage 4 chronic kidney disease, or unspecified chronic kidney disease: Secondary | ICD-10-CM | POA: Insufficient documentation

## 2016-06-26 DIAGNOSIS — N184 Chronic kidney disease, stage 4 (severe): Secondary | ICD-10-CM | POA: Insufficient documentation

## 2016-06-26 DIAGNOSIS — Z79899 Other long term (current) drug therapy: Secondary | ICD-10-CM | POA: Diagnosis not present

## 2016-06-26 DIAGNOSIS — Z7902 Long term (current) use of antithrombotics/antiplatelets: Secondary | ICD-10-CM | POA: Insufficient documentation

## 2016-06-26 DIAGNOSIS — E1122 Type 2 diabetes mellitus with diabetic chronic kidney disease: Secondary | ICD-10-CM | POA: Diagnosis not present

## 2016-06-26 DIAGNOSIS — Z87891 Personal history of nicotine dependence: Secondary | ICD-10-CM | POA: Diagnosis not present

## 2016-06-26 HISTORY — PX: IR GENERIC HISTORICAL: IMG1180011

## 2016-06-26 LAB — CBC
HCT: 34.8 % — ABNORMAL LOW (ref 39.0–52.0)
Hemoglobin: 12.1 g/dL — ABNORMAL LOW (ref 13.0–17.0)
MCH: 30.4 pg (ref 26.0–34.0)
MCHC: 34.8 g/dL (ref 30.0–36.0)
MCV: 87.4 fL (ref 78.0–100.0)
Platelets: 147 10*3/uL — ABNORMAL LOW (ref 150–400)
RBC: 3.98 MIL/uL — ABNORMAL LOW (ref 4.22–5.81)
RDW: 13.1 % (ref 11.5–15.5)
WBC: 5.9 10*3/uL (ref 4.0–10.5)

## 2016-06-26 LAB — BASIC METABOLIC PANEL
Anion gap: 12 (ref 5–15)
BUN: 27 mg/dL — ABNORMAL HIGH (ref 6–20)
CO2: 22 mmol/L (ref 22–32)
Calcium: 9.7 mg/dL (ref 8.9–10.3)
Chloride: 104 mmol/L (ref 101–111)
Creatinine, Ser: 2.21 mg/dL — ABNORMAL HIGH (ref 0.61–1.24)
GFR calc Af Amer: 33 mL/min — ABNORMAL LOW (ref >60)
GFR calc non Af Amer: 29 mL/min — ABNORMAL LOW (ref >60)
Glucose, Bld: 116 mg/dL — ABNORMAL HIGH (ref 65–99)
Potassium: 3.8 mmol/L (ref 3.5–5.1)
Sodium: 138 mmol/L (ref 135–145)

## 2016-06-26 LAB — APTT: aPTT: 30 seconds (ref 24–36)

## 2016-06-26 LAB — PROTIME-INR
INR: 1.01
Prothrombin Time: 13.3 seconds (ref 11.4–15.2)

## 2016-06-26 LAB — GLUCOSE, CAPILLARY: Glucose-Capillary: 94 mg/dL (ref 65–99)

## 2016-06-26 MED ORDER — ONDANSETRON HCL 4 MG PO TABS: 4.0000 mg | ORAL_TABLET | Freq: Four times a day (QID) | ORAL | Status: DC | PRN

## 2016-06-26 MED ORDER — CLOPIDOGREL BISULFATE 75 MG PO TABS
75.0000 mg | ORAL_TABLET | Freq: Every day | ORAL | Status: DC
  Administered 2016-06-26 – 2016-06-27 (×2): 75 mg via ORAL
  Filled 2016-06-26: qty 1

## 2016-06-26 MED ORDER — HEPARIN SODIUM (PORCINE) 1000 UNIT/ML IJ SOLN
INTRAMUSCULAR | Status: AC | PRN
  Administered 2016-06-26: 3000 [IU] via INTRAVENOUS

## 2016-06-26 MED ORDER — FENTANYL CITRATE (PF) 100 MCG/2ML IJ SOLN
INTRAMUSCULAR | Status: AC
  Filled 2016-06-26: qty 4

## 2016-06-26 MED ORDER — MIDAZOLAM HCL 2 MG/2ML IJ SOLN
INTRAMUSCULAR | Status: AC
  Filled 2016-06-26: qty 6

## 2016-06-26 MED ORDER — ACETAMINOPHEN 650 MG RE SUPP: 650.0000 mg | Freq: Four times a day (QID) | RECTAL | Status: DC | PRN

## 2016-06-26 MED ORDER — MIDAZOLAM HCL 2 MG/2ML IJ SOLN
INTRAMUSCULAR | Status: AC | PRN
  Administered 2016-06-26 (×8): 1 mg via INTRAVENOUS

## 2016-06-26 MED ORDER — ALBUTEROL SULFATE (2.5 MG/3ML) 0.083% IN NEBU: 3.0000 mL | INHALATION_SOLUTION | RESPIRATORY_TRACT | Status: DC | PRN

## 2016-06-26 MED ORDER — HEPARIN SODIUM (PORCINE) 1000 UNIT/ML IJ SOLN
INTRAMUSCULAR | Status: AC
  Filled 2016-06-26: qty 1

## 2016-06-26 MED ORDER — SODIUM CHLORIDE 0.9 % IV SOLN: INTRAVENOUS | Status: DC

## 2016-06-26 MED ORDER — MIDAZOLAM HCL 2 MG/2ML IJ SOLN
INTRAMUSCULAR | Status: AC
  Filled 2016-06-26: qty 2

## 2016-06-26 MED ORDER — LIDOCAINE HCL (PF) 1 % IJ SOLN
INTRAMUSCULAR | Status: AC
  Filled 2016-06-26: qty 10

## 2016-06-26 MED ORDER — IOPAMIDOL (ISOVUE-300) INJECTION 61%
INTRAVENOUS | Status: AC
  Filled 2016-06-26: qty 50

## 2016-06-26 MED ORDER — ONDANSETRON HCL 4 MG/2ML IJ SOLN: 4.0000 mg | Freq: Four times a day (QID) | INTRAMUSCULAR | Status: DC | PRN

## 2016-06-26 MED ORDER — FENTANYL CITRATE (PF) 100 MCG/2ML IJ SOLN
INTRAMUSCULAR | Status: AC | PRN
  Administered 2016-06-26: 50 ug via INTRAVENOUS
  Administered 2016-06-26 (×4): 25 ug via INTRAVENOUS
  Administered 2016-06-26: 50 ug via INTRAVENOUS

## 2016-06-26 MED ORDER — LIDOCAINE HCL 1 % IJ SOLN
INTRAMUSCULAR | Status: AC | PRN
  Administered 2016-06-26 (×2): 10 mL

## 2016-06-26 MED ORDER — ASPIRIN EC 81 MG PO TBEC
81.0000 mg | DELAYED_RELEASE_TABLET | Freq: Every day | ORAL | Status: DC
  Administered 2016-06-27: 81 mg via ORAL
  Filled 2016-06-26: qty 1

## 2016-06-26 MED ORDER — SODIUM CHLORIDE 0.9 % IV SOLN
INTRAVENOUS | Status: DC
  Administered 2016-06-26: 20 mL/h via INTRAVENOUS

## 2016-06-26 MED ORDER — SODIUM CHLORIDE 0.9 % IV SOLN
INTRAVENOUS | Status: DC
  Administered 2016-06-26: 23:00:00 via INTRAVENOUS

## 2016-06-26 MED ORDER — CLOPIDOGREL BISULFATE 75 MG PO TABS
ORAL_TABLET | ORAL | Status: AC
  Filled 2016-06-26: qty 1

## 2016-06-26 MED ORDER — TIOTROPIUM BROMIDE MONOHYDRATE 18 MCG IN CAPS
1.0000 | ORAL_CAPSULE | Freq: Every day | RESPIRATORY_TRACT | Status: DC
  Filled 2016-06-26: qty 5

## 2016-06-26 MED ORDER — ACETAMINOPHEN 325 MG PO TABS: 650.0000 mg | ORAL_TABLET | Freq: Four times a day (QID) | ORAL | Status: DC | PRN

## 2016-06-26 NOTE — Discharge Instructions (Signed)
Femoral Site Care °Refer to this sheet in the next few weeks. These instructions provide you with information about caring for yourself after your procedure. Your health care provider may also give you more specific instructions. Your treatment has been planned according to current medical practices, but problems sometimes occur. Call your health care provider if you have any problems or questions after your procedure. °What can I expect after the procedure? °After your procedure, it is typical to have the following: °· Bruising at the site that usually fades within 1-2 weeks. °· Blood collecting in the tissue (hematoma) that may be painful to the touch. It should usually decrease in size and tenderness within 1-2 weeks. °Follow these instructions at home: °· Take medicines only as directed by your health care provider. °· You may shower 24-48 hours after the procedure or as directed by your health care provider. Remove the bandage (dressing) and gently wash the site with plain soap and water. Pat the area dry with a clean towel. Do not rub the site, because this may cause bleeding. °· Do not take baths, swim, or use a hot tub until your health care provider approves. °· Check your insertion site every day for redness, swelling, or drainage. °· Do not apply powder or lotion to the site. °· Limit use of stairs to twice a day for the first 2-3 days or as directed by your health care provider. °· Do not squat for the first 2-3 days or as directed by your health care provider. °· Do not lift over 10 lb (4.5 kg) for 5 days after your procedure or as directed by your health care provider. °· Ask your health care provider when it is okay to: °¨ Return to work or school. °¨ Resume usual physical activities or sports. °¨ Resume sexual activity. °· Do not drive home if you are discharged the same day as the procedure. Have someone else drive you. °· You may drive 24 hours after the procedure unless otherwise instructed by  your health care provider. °· Do not operate machinery or power tools for 24 hours after the procedure or as directed by your health care provider. °· If your procedure was done as an outpatient procedure, which means that you went home the same day as your procedure, a responsible adult should be with you for the first 24 hours after you arrive home. °· Keep all follow-up visits as directed by your health care provider. This is important. °Contact a health care provider if: °· You have a fever. °· You have chills. °· You have increased bleeding from the site. Hold pressure on the site. °Get help right away if: °· You have unusual pain at the site. °· You have redness, warmth, or swelling at the site. °· You have drainage (other than a small amount of blood on the dressing) from the site. °· The site is bleeding, and the bleeding does not stop after 30 minutes of holding steady pressure on the site. °· Your leg or foot becomes pale, cool, tingly, or numb. °This information is not intended to replace advice given to you by your health care provider. Make sure you discuss any questions you have with your health care provider. °Document Released: 12/05/2013 Document Revised: 09/09/2015 Document Reviewed: 10/21/2013 °Elsevier Interactive Patient Education © 2017 Elsevier Inc[employer] °Moderate Conscious Sedation, Adult, Care After °These instructions provide you with information about caring for yourself after your procedure. Your health care provider may also give you more specific   instructions. Your treatment has been planned according to current medical practices, but problems sometimes occur. Call your health care provider if you have any problems or questions after your procedure. °What can I expect after the procedure? °After your procedure, it is common: °· To feel sleepy for several hours. °· To feel clumsy and have poor balance for several hours. °· To have poor judgment for several hours. °· To vomit if you eat too  soon. °Follow these instructions at home: °For at least 24 hours after the procedure:  ° °· Do not: °¨ Participate in activities where you could fall or become injured. °¨ Drive. °¨ Use heavy machinery. °¨ Drink alcohol. °¨ Take sleeping pills or medicines that cause drowsiness. °¨ Make important decisions or sign legal documents. °¨ Take care of children on your own. °· Rest. °Eating and drinking  °· Follow the diet recommended by your health care provider. °· If you vomit: °¨ Drink water, juice, or soup when you can drink without vomiting. °¨ Make sure you have little or no nausea before eating solid foods. °General instructions  °· Have a responsible adult stay with you until you are awake and alert. °· Take over-the-counter and prescription medicines only as told by your health care provider. °· If you smoke, do not smoke without supervision. °· Keep all follow-up visits as told by your health care provider. This is important. °Contact a health care provider if: °· You keep feeling nauseous or you keep vomiting. °· You feel light-headed. °· You develop a rash. °· You have a fever. °Get help right away if: °· You have trouble breathing. °This information is not intended to replace advice given to you by your health care provider. Make sure you discuss any questions you have with your health care provider. °Document Released: 01/22/2013 Document Revised: 09/06/2015 Document Reviewed: 07/24/2015 °Elsevier Interactive Patient Education © 2017 Elsevier Inc. ° °

## 2016-06-26 NOTE — Sedation Documentation (Signed)
Dr. Kathlene Cote at beside discussing results and plan of care with pt and wife.

## 2016-06-26 NOTE — Progress Notes (Signed)
Client was being taken to car via wheelchair by Jerilynn Som and brought back to room due to color pale and diaphoretic; patient states "I don't feel right" then patient became unresponsive to verbal stimuli and transferred to bed and called rapid response and Dr Kathlene Cote

## 2016-06-26 NOTE — Progress Notes (Signed)
500cc fluid bolus given as ordered by Carol Ada

## 2016-06-26 NOTE — Progress Notes (Signed)
Patient ID: Marcus Herrera, male   DOB: [birth year], 66 y.o.   MRN: [id number]   Rx Plavix 75 mg daily #30 to pt 5 refills  Will hear from clinic scheduler for time and date of 4 week follow up

## 2016-06-26 NOTE — Progress Notes (Signed)
After back to bed responding to verbal stimuli

## 2016-06-26 NOTE — Sedation Documentation (Signed)
Gauze/tegaderm bandage applied to R femoral artery puncture. Groin level 0, drsg CDI, 3+RDP.

## 2016-06-26 NOTE — H&P (Signed)
Chief Complaint: Patient was seen in consultation today for  Renal arteriogram with possible interventionat the request of Dr Elmarie Shiley  Referring Physician(s): Dr Graylon Gunning  Supervising Physician: Aletta Edouard  Patient Status: [facility] - Out-pt  History of Present Illness: Marcus Herrera is a 66 y.o. male   Known CKD Followed with Dr Graylon Gunning Cr stable at 2 range Hx HTN; DM; COPD US Renal artery: 05/24/16: IMPRESSION: 1. Elevated peak systolic velocities in the proximal left renal artery suggesting hemodynamically significant stenosis. Consider catheter angiography for further evaluation, as the lesion may be amenable to percutaneous treatment if confirmed to be significant. 2. Right renal parenchymal atrophy.  Was seen in consultation with Dr Kathlene Cote 2/28: I met with [name] and discussed performing renal arteriography with possible intervention due to the duplex ultrasound findings suggestive of potential underlying left renal artery stenosis. We reviewed duplex ultrasound findings. 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 This may be at the level of the renal parenchyma, however, and not the main renal artery segment. 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 If this is uncovered with CO2 arteriography and determined to be hemodynamically significant, this would be potentially treatable with placement of an intravascular stent. I told Mr. Mckowen that treatment of a significant main renal artery segment stenosis may or may not have an impact on progression of chronic kidney disease. However, there may still be some long-term benefit in reducing the risk of progressive atrophy of the left kidney if a significant left renal artery stenosis is uncovered by arteriography. I did make him aware  that renal function could transiently worsened after stent placement due to microembolization to intraparenchymal branches after stent placement due to plaque rupture.  Details and risks of arteriography and possible stent placement were discussed with the patient. After discussion, the patient would like to proceed. Scheduled for today.  Past Medical History:  Diagnosis Date  . Chronic kidney disease   . COPD (chronic obstructive pulmonary disease) ([place])   . Hypertension     Past Surgical History:  Procedure Laterality Date  . IR GENERIC HISTORICAL  06/14/2016   IR RADIOLOGIST EVAL & MGMT 06/14/2016 Aletta Edouard, MD [facility] INTERV RAD    Allergies: Sulfa antibiotics  Medications: Prior to Admission medications   Medication Sig Start Date End Date Taking? Authorizing Provider  albuterol (PROVENTIL HFA;VENTOLIN HFA) 108 (90 Base) MCG/ACT inhaler Inhale 2 puffs into the lungs every 4 (four) hours as needed for wheezing or shortness of breath.   Yes Historical Provider, MD  aspirin 81 MG tablet Take 81 mg by mouth daily.   Yes Historical Provider, MD  atorvastatin (LIPITOR) 10 MG tablet Take 10 mg by mouth daily.   Yes Historical Provider, MD  cholecalciferol (VITAMIN D) 1000 units tablet Take 1,000 Units by mouth daily.   Yes Historical Provider, MD  doxycycline (ADOXA) 50 MG tablet Take 50 mg by mouth daily.   Yes Historical Provider, MD  gemfibrozil (LOPID) 600 MG tablet Take 600 mg by mouth daily.   Yes Historical Provider, MD  losartan-hydrochlorothiazide (HYZAAR) 100-12.5 MG tablet Take 0.5 tablets by mouth daily.    Yes Historical Provider, MD  Omega-3 Fatty Acids (FISH OIL) 1000 MG CPDR Take 1 capsule by mouth daily.   Yes Historical Provider, MD  Tiotropium Bromide Monohydrate (SPIRIVA RESPIMAT) 2.5 MCG/ACT AERS Inhale 2 puffs into the lungs daily.   Yes Historical Provider, MD     No family history on file.  Social History   Social History  . Marital status: Married     Spouse name: N/A  . Number of children: N/A  . Years of education: N/A   Social History Main Topics  . Smoking status: Former Research scientist (life sciences)  . Smokeless tobacco: Never Used  . Alcohol use 4.2 - 6.0 oz/week    7 - 10 Cans of beer per week  . Drug use: No  . Sexual activity: Not on file   Other Topics Concern  . Not on file   Social History Narrative  . No narrative on file    Review of Systems: A 12 point ROS discussed and pertinent positives are indicated in the HPI above.  All other systems are negative.  Review of Systems  Constitutional: Negative for activity change, appetite change, fatigue and fever.  Respiratory: Negative for cough and shortness of breath.   Cardiovascular: Negative for chest pain.  Gastrointestinal: Negative for abdominal pain.  Neurological: Negative for weakness.  Psychiatric/Behavioral: Negative for behavioral problems and confusion.    Vital Signs: BP 126/61   Pulse 70   Temp 99 F (37.2 C)   Resp 18   Ht 6' (1.829 m)   Wt 195 lb (88.5 kg)   SpO2 99%   BMI 26.45 kg/m   Physical Exam  Constitutional: He is oriented to person, place, and time. He appears well-nourished.  Cardiovascular: Normal rate, regular rhythm and normal heart sounds.   Pulmonary/Chest: Effort normal and breath sounds normal.  Abdominal: Soft. Bowel sounds are normal.  Musculoskeletal: Normal range of motion.  Neurological: He is alert and oriented to person, place, and time.  Skin: Skin is warm and dry.  Psychiatric: He has a normal mood and affect. His behavior is normal. Judgment and thought content normal.  Nursing note and vitals reviewed.   Mallampati Score:  MD Evaluation Airway: WNL Heart: WNL Abdomen: WNL Chest/ Lungs: WNL ASA  Classification: 3 Mallampati/Airway Score: One  Imaging: Ir Radiologist Eval & Mgmt  Result Date: 06/23/2016 Please refer to notes tab for details about interventional procedure. (Op Note)   Labs:  CBC: No results for  input(s): WBC, HGB, HCT, PLT in the last 8760 hours.  COAGS: No results for input(s): INR, APTT in the last 8760 hours.  BMP: No results for input(s): NA, K, CL, CO2, GLUCOSE, BUN, CALCIUM, CREATININE, GFRNONAA, GFRAA in the last 8760 hours.  Invalid input(s): CMP  LIVER FUNCTION TESTS: No results for input(s): BILITOT, AST, ALT, ALKPHOS, PROT, ALBUMIN in the last 8760 hours.  TUMOR MARKERS: No results for input(s): AFPTM, CEA, CA199, CHROMGRNA in the last 8760 hours.  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 All of the patient's questions were answered, patient is agreeable to proceed. Consent signed and in chart.  Thank you for this interesting consult.  I greatly enjoyed meeting [facility] and look forward to participating in their care.  A copy of this report was sent to the requesting provider on this date.  Electronically Signed: Monia Sabal A 06/26/2016, 7:42 AM   I spent a total of  30 Minutes  in face to face in clinical consultation, greater than 50% of which was counseling/coordinating care for renal arteriogram with possible intervention

## 2016-06-26 NOTE — Significant Event (Signed)
Rapid Response Event Note  Overview:  Called to assist with patient with possible syncopal episode Time Called: 1520 Arrival Time: 1520    Initial Focused Assessment:  On arrival patient supine in bed with feet up.  Alert - warm and dry - good color.  Oriented  - denies pain or SOB - remembers feeling warm and then things went fuzzy - staff report he was dressed = in wheelchair and on way to car when he suddenly had decreased LOC - taken back to SS - placed in bed - IV was restarted - Dr. Kathlene Cote paged.  O2 sats 100% on 4 liters nasal cannula.  BP 109/68 Pulse 70 .  Abd soft - denies flank pain - right femoral stick site DDI - soft - no hematoma noted.  No bruising to femoral or flanks.  Denies SOB or nausea or chest pain.  Patient and wife state this has happened on occasion prior.  CBG 94.  Interventions:  Saverio Danker PA to bedside.  Patient alert - states he feel fine - warm and dry - oxygen off - remains 100% on RA  BP 127/72 Pulse 70- up to side of bed - no dizziness - BP 103/62 HR remains 70 -resps regular and unlabored - after few minutes patient had sudden change - "things fuzzy" - placed back in the bed - 500 cc NS bolus started. Again denies CP or SOB.   Saverio Danker updating Dr. Kathlene Cote.   Pressure responding - 125/61 - staff at bedside - handoff to [facility].  To call as needed.   Plan of Care (if not transferred):  Event Summary: Name of Physician Notified: Dr. Monna Fam PA at 1520    at    Outcome: Other (Comment) (admitted for obs - was outpatient)  Event End Time: 1555  Quin Hoop

## 2016-06-26 NOTE — Sedation Documentation (Signed)
5 Fr sheath removed from R femoral artery by Dr. Kathlene Cote. Hemostasis achieved using Exoseal closure device. Groin level 0, 3+RDP.

## 2016-06-26 NOTE — Progress Notes (Signed)
Report called to [facility] for 5-W-29 and transferred via bed

## 2016-06-26 NOTE — Progress Notes (Signed)
patient on his way out in a wheelchair and had a syncopal episode.  Rapid response was called and patient brought back down to [facility].  He has been evaluated and was feeling well until we sat him up for about 2 minutes and he became diaphoretic and light headed.  He was laid back down and immediately felt better.  His laying BP was 127/72 and when sitting on the bedside he dropped to 103/60.  I have discussed with Dr. Kathlene Cote and we feel the best option is have him admitted overnight for fluids and observation.  He denies any abdominal or flank pain.  He denies any bleeding.  PE: Heart: regular Lungs: CTAB Abd: soft, Nt, ND, +Bs.  Right inguinal site is c/d/i with no bleeding present Ext: bilateral radial pulses  Plan: 1. Admit for observation and hydration.  Suspect orthostasis s/p bilateral renal stenting.

## 2016-06-26 NOTE — Procedures (Signed)
Interventional Radiology Procedure Note  Procedure: Bilateral renal arteriography  Complications: None  Estimated Blood Loss: < 10 mL  Findings:  Significant bilateral renal artery stenosis; > 75-80% on right and critical on left. Treated with 6x18 mm Palmaz Blue stent on right and 6x15 mm Palmaz Blue stent on left. Both arteries widely patent after stent placement. See full dictated note.  Venetia Night. Kathlene Cote, M.D Pager:  [phone]

## 2016-06-26 NOTE — Progress Notes (Signed)
At 1538 attempt to sit client up an client became pale and diaphoretic again and back to bed; IV fluid bolus give as ordered

## 2016-06-27 ENCOUNTER — Encounter (HOSPITAL_COMMUNITY): Payer: Self-pay

## 2016-06-27 ENCOUNTER — Ambulatory Visit (HOSPITAL_COMMUNITY): Payer: BLUE CROSS/BLUE SHIELD | Admitting: Physical Therapy

## 2016-06-27 DIAGNOSIS — R55 Syncope and collapse: Secondary | ICD-10-CM | POA: Diagnosis not present

## 2016-06-27 DIAGNOSIS — I701 Atherosclerosis of renal artery: Secondary | ICD-10-CM | POA: Diagnosis not present

## 2016-06-27 LAB — COMPREHENSIVE METABOLIC PANEL
ALT: 18 U/L (ref 17–63)
AST: 20 U/L (ref 15–41)
Albumin: 3.7 g/dL (ref 3.5–5.0)
Alkaline Phosphatase: 48 U/L (ref 38–126)
Anion gap: 7 (ref 5–15)
BUN: 22 mg/dL — ABNORMAL HIGH (ref 6–20)
CO2: 23 mmol/L (ref 22–32)
Calcium: 9.1 mg/dL (ref 8.9–10.3)
Chloride: 109 mmol/L (ref 101–111)
Creatinine, Ser: 1.89 mg/dL — ABNORMAL HIGH (ref 0.61–1.24)
GFR calc Af Amer: 40 mL/min — ABNORMAL LOW (ref >60)
GFR calc non Af Amer: 35 mL/min — ABNORMAL LOW (ref >60)
Glucose, Bld: 105 mg/dL — ABNORMAL HIGH (ref 65–99)
Potassium: 3.7 mmol/L (ref 3.5–5.1)
Sodium: 139 mmol/L (ref 135–145)
Total Bilirubin: 0.9 mg/dL (ref 0.3–1.2)
Total Protein: 6.2 g/dL — ABNORMAL LOW (ref 6.5–8.1)

## 2016-06-27 LAB — CBC
HCT: 32.3 % — ABNORMAL LOW (ref 39.0–52.0)
Hemoglobin: 11.3 g/dL — ABNORMAL LOW (ref 13.0–17.0)
MCH: 31 pg (ref 26.0–34.0)
MCHC: 35 g/dL (ref 30.0–36.0)
MCV: 88.7 fL (ref 78.0–100.0)
Platelets: 142 10*3/uL — ABNORMAL LOW (ref 150–400)
RBC: 3.64 MIL/uL — ABNORMAL LOW (ref 4.22–5.81)
RDW: 13.6 % (ref 11.5–15.5)
WBC: 7.2 10*3/uL (ref 4.0–10.5)

## 2016-06-27 NOTE — Progress Notes (Signed)
Raynald Blend to be D/C'd Home per MD order.  Discussed with the patient and all questions fully answered.  VSS, Skin clean, dry and intact without evidence of skin break down, no evidence of skin tears noted. IV catheter discontinued intact. Site without signs and symptoms of complications. Dressing and pressure applied.  An After Visit Summary was printed and given to the patient. Patient received prescription.  D/c education completed with patient/family including follow up instructions, medication list, d/c activities limitations if indicated, with other d/c instructions as indicated by MD - patient able to verbalize understanding, all questions fully answered.   Patient instructed to return to ED, call 911, or call MD for any changes in condition.   Patient escorted via [place], and D/C home via private auto.  Luci Bank 06/27/2016 11:31 AM

## 2016-06-27 NOTE — Discharge Summary (Signed)
Patient ID: Marcus Herrera MRN: [id number] DOB/AGE: Jun 29, 1950 66 y.o.  Admit date: 06/26/2016 Discharge date: 06/27/2016  Supervising Physician: Aletta Edouard  Patient Status: [facility] - In-pt  Admission Diagnoses: Renal artery stenosis  Discharge Diagnoses:  Active Problems:   Syncope   Discharged Condition: improved  Hospital Course: Known CKD and followed with Dr Graylon Gunning. Referred to Dr Kathlene Cote regarding Bilateral renal artery stenosis found on doppler studies. 3/12 renal arteriogram revealed B stenosis and B renal artery; bilateral angioplasty/stents placement were placed. Pt did well post procedure. When going to car for discharge from [facility] - pt became syncopal and brought back to [facility] room. Was admitted for 24 hr observation and fluid administration per Dr Kathlene Cote. Has done well overnight. Urinating on own; clear yellow. BP has remained stable; orthostatic readings are wnl. Cr (2.21) now 1.89. Has ambulated on own in room. Pt has no complaints; feels well Eating well; denies N/V. I have seen and examined pt. Have discussed pt status with Dr Kathlene Cote. Plan for discharge to home.  Consults: None  Significant Diagnostic Studies: Renal arteriogram  Treatments: Findings:  Significant bilateral renal artery stenosis; > 75-80% on right and critical on left. Treated with 6x18 mm Palmaz Blue stent on right and 6x15 mm Palmaz Blue stent on left. Both arteries widely patent after stent placement.  Discharge Exam: Blood pressure (!) 109/54, pulse (!) 58, temperature 98.4 F (36.9 C), temperature source Oral, resp. rate 18, height 6' (1.829 m), weight 177 lb 12.8 oz (80.6 kg), SpO2 98 %.  PE: A/O Appropriate Pleasant Heart: RRR Lungs: CTA Abd: soft; +BS; NT No masses Extr: FROM Rt groin NT; no bleeding; no hematoma Rt foot : 2+ pulses Ambulating Moves all 4s; good strength and sensation  Results for orders placed or performed during the hospital encounter of  06/26/16  APTT upon arrival  Result Value Ref Range   aPTT 30 24 - 36 seconds  CBC upon arrival  Result Value Ref Range   WBC 5.9 4.0 - 10.5 K/uL   RBC 3.98 (L) 4.22 - 5.81 MIL/uL   Hemoglobin 12.1 (L) 13.0 - 17.0 g/dL   HCT 34.8 (L) 39.0 - 52.0 %   MCV 87.4 78.0 - 100.0 fL   MCH 30.4 26.0 - 34.0 pg   MCHC 34.8 30.0 - 36.0 g/dL   RDW 13.1 11.5 - 15.5 %   Platelets 147 (L) 150 - 400 K/uL  Protime-INR upon arrival  Result Value Ref Range   Prothrombin Time 13.3 11.4 - 15.2 seconds   INR 2.68   Basic metabolic panel  Result Value Ref Range   Sodium 138 135 - 145 mmol/L   Potassium 3.8 3.5 - 5.1 mmol/L   Chloride 104 101 - 111 mmol/L   CO2 22 22 - 32 mmol/L   Glucose, Bld 116 (H) 65 - 99 mg/dL   BUN 27 (H) 6 - 20 mg/dL   Creatinine, Ser 2.21 (H) 0.61 - 1.24 mg/dL   Calcium 9.7 8.9 - 10.3 mg/dL   GFR calc non Af Amer 29 (L) >60 mL/min   GFR calc Af Amer 33 (L) >60 mL/min   Anion gap 12 5 - 15  Glucose, capillary  Result Value Ref Range   Glucose-Capillary 94 65 - 99 mg/dL  Comprehensive metabolic panel  Result Value Ref Range   Sodium 139 135 - 145 mmol/L   Potassium 3.7 3.5 - 5.1 mmol/L   Chloride 109 101 - 111 mmol/L   CO2 23 22 -  32 mmol/L   Glucose, Bld 105 (H) 65 - 99 mg/dL   BUN 22 (H) 6 - 20 mg/dL   Creatinine, Ser 1.89 (H) 0.61 - 1.24 mg/dL   Calcium 9.1 8.9 - 10.3 mg/dL   Total Protein 6.2 (L) 6.5 - 8.1 g/dL   Albumin 3.7 3.5 - 5.0 g/dL   AST 20 15 - 41 U/L   ALT 18 17 - 63 U/L   Alkaline Phosphatase 48 38 - 126 U/L   Total Bilirubin 0.9 0.3 - 1.2 mg/dL   GFR calc non Af Amer 35 (L) >60 mL/min   GFR calc Af Amer 40 (L) >60 mL/min   Anion gap 7 5 - 15  CBC  Result Value Ref Range   WBC 7.2 4.0 - 10.5 K/uL   RBC 3.64 (L) 4.22 - 5.81 MIL/uL   Hemoglobin 11.3 (L) 13.0 - 17.0 g/dL   HCT 32.3 (L) 39.0 - 52.0 %   MCV 88.7 78.0 - 100.0 fL   MCH 31.0 26.0 - 34.0 pg   MCHC 35.0 30.0 - 36.0 g/dL   RDW 13.6 11.5 - 15.5 %   Platelets 142 (L) 150 - 400 K/uL      Disposition: Bilateral renal artery stent placements. Developed syncopal episode as was on way to car for discharge from [facility]. Admitted overnight for 24 observation. Has done well and orthorstatic BP are wnl Discussed status with Dr Kathlene Cote. Plan for discharge to home now. Discontinue all BP meds for now. Check BP at home daily See primary MD or Dr Posey Pronto in 7-10 days for BP check in office. Continue all other home meds Add Plavix 75 mg daily; Rx #30 and 5 refills Follow up 4 weeks with Dr Kathlene Cote Pt has good understanding of discharge instructions  Discharge Instructions    Call MD for:  difficulty breathing, headache or visual disturbances    Complete by:  As directed    Call MD for:  extreme fatigue    Complete by:  As directed    Call MD for:  hives    Complete by:  As directed    Call MD for:  persistant dizziness or light-headedness    Complete by:  As directed    Call MD for:  persistant nausea and vomiting    Complete by:  As directed    Call MD for:  redness, tenderness, or signs of infection (pain, swelling, redness, odor or green/yellow discharge around incision site)    Complete by:  As directed    Call MD for:  severe uncontrolled pain    Complete by:  As directed    Call MD for:  temperature >100.4    Complete by:  As directed    Diet - low sodium heart healthy    Complete by:  As directed    Discharge instructions    Complete by:  As directed    DC BP meds for now; follow BP at home daily; see PCP or Dr Posey Pronto in next 7-10 days for BP check; continue all other meds; Plavix 75 mg daily. 24 week follow up with Dr Nilda Calamity will call pt with time and date; call [phone] if questions or concerns   Discharge wound care:    Complete by:  As directed    May shower today; keep new bandaid on Rt groin site daily x 1 week   Driving Restrictions    Complete by:  As directed    No driving x 24 hrs  Increase activity slowly    Complete by:  As  directed    Lifting restrictions    Complete by:  As directed    No lifting over 10 lbs x 3 days     Allergies as of 06/27/2016      Reactions   Sulfa Antibiotics Hives      Medication List    STOP taking these medications   losartan-hydrochlorothiazide 100-12.5 MG tablet Commonly known as:  HYZAAR     TAKE these medications   albuterol 108 (90 Base) MCG/ACT inhaler Commonly known as:  PROVENTIL HFA;VENTOLIN HFA Inhale 2 puffs into the lungs every 4 (four) hours as needed for wheezing or shortness of breath.   aspirin 81 MG tablet Take 81 mg by mouth daily.   atorvastatin 10 MG tablet Commonly known as:  LIPITOR Take 10 mg by mouth daily.   cholecalciferol 1000 units tablet Commonly known as:  VITAMIN D Take 1,000 Units by mouth daily.   doxycycline 50 MG tablet Commonly known as:  ADOXA Take 50 mg by mouth daily.   Fish Oil 1000 MG Cpdr Take 1 capsule by mouth daily.   gemfibrozil 600 MG tablet Commonly known as:  LOPID Take 600 mg by mouth daily.   SPIRIVA RESPIMAT 2.5 MCG/ACT Aers Generic drug:  Tiotropium Bromide Monohydrate Inhale 2 puffs into the lungs daily.      Follow-up Information    YAMAGATA,GLENN T, MD Follow up.   Specialty:  Interventional Radiology Why:  4 week follow up with Dr Kathlene Cote at clinic. pt will hear from clinic scheduler with time and date. call [phone] if questions or concerns Contact information: [address] [place] [address] [phone]            Electronically Signed: Monia Sabal A 06/27/2016, 8:40 AM   I have spent Greater Than 30 Minutes discharging [facility].

## 2016-06-28 ENCOUNTER — Telehealth (HOSPITAL_COMMUNITY): Payer: Self-pay | Admitting: Physical Therapy

## 2016-06-28 NOTE — Telephone Encounter (Signed)
Pt wants to wait and start next week

## 2016-06-29 ENCOUNTER — Ambulatory Visit (HOSPITAL_COMMUNITY): Payer: BLUE CROSS/BLUE SHIELD | Admitting: Physical Therapy

## 2016-07-04 ENCOUNTER — Ambulatory Visit (HOSPITAL_COMMUNITY): Payer: BLUE CROSS/BLUE SHIELD | Admitting: Physical Therapy

## 2016-07-04 DIAGNOSIS — H8111 Benign paroxysmal vertigo, right ear: Secondary | ICD-10-CM

## 2016-07-04 NOTE — Therapy (Signed)
[place], [place], [address] Phone: [phone]   Fax:  [phone]  Physical Therapy Treatment  Patient Details  Name: Marcus Herrera MRN: [id number] Date of Birth: 1950-07-15 Referring Provider: Elmarie Shiley  Encounter Date: 07/04/2016      PT End of Session - 07/04/16 0848    Visit Number 6   Number of Visits 6   Date for PT Re-Evaluation 07/08/16   Authorization Type [employer]   Authorization - Visit Number 6   Authorization - Number of Visits 6   PT Start Time 0818   PT Stop Time 0845   PT Time Calculation (min) 27 min   Activity Tolerance Patient tolerated treatment well  c/o Lt hip catching following tandem stance on airex   Behavior During Therapy [facility] for tasks assessed/performed      Past Medical History:  Diagnosis Date  . Chronic kidney disease   . COPD (chronic obstructive pulmonary disease) ([place])   . Hypertension   . Vertigo     Past Surgical History:  Procedure Laterality Date  . IR GENERIC HISTORICAL  06/14/2016   IR RADIOLOGIST EVAL & MGMT 06/14/2016 Aletta Edouard, MD [facility] INTERV RAD  . IR GENERIC HISTORICAL  06/26/2016   IR US GUIDE VASC ACCESS RIGHT 06/26/2016 Aletta Edouard, MD [facility]INTER[facility]  . IR GENERIC HISTORICAL  06/26/2016   IR RENAL BILAT S&I MOD SED 06/26/2016 Aletta Edouard, MD [facility]INTER[facility] RAD  . IR GENERIC HISTORICAL  06/26/2016   IR TRANSCATH PLC STENT 1ST ART NOT LE CV CAR VERT CAR 06/26/2016 Aletta Edouard, MD [facility] RAD  . IR GENERIC HISTORICAL  06/26/2016   IR TRANSCATH PLC STENT EA ADD ART NOT LE CV CAR VERT CAR 06/26/2016 Aletta Edouard, MD [facility]INTER[facility] RAD    There were no vitals filed for this visit.      Subjective Assessment - 07/04/16 0820    Subjective Pt states that he had bilateral stents placed in his kidneys.  He staates that this has helped his blood pressure. He states that his dizziness is much better.  He had a small momentary issue yesterday.  He has not done them  recently due to his kidney problems.    Currently in Pain? No/denies            [facility] PT Assessment - 07/04/16 0001      Assessment   Medical Diagnosis BPPV   Referring Provider Elmarie Shiley   Onset Date/Surgical Date 11/30/15   Next MD Visit not scheduled    Prior Therapy none     Precautions   Precautions None     Restrictions   Weight Bearing Restrictions No     Balance Screen   Has the patient fallen in the past 6 months No   Has the patient had a decrease in activity level because of a fear of falling?  No   Is the patient reluctant to leave their home because of a fear of falling?  No     Prior Function   Level of Independence Independent     Cognition   Overall Cognitive Status Within Functional Limits for tasks assessed     Functional Tests   Functional tests Single leg stance     Single Leg Stance   Comments Lt 10; Rt 66             Vestibular Assessment - 07/04/16 0001      Vestibular Assessment   General Observation normal  Symptom Behavior   Type of Dizziness Imbalance   Frequency of Dizziness a couple times a week was 5-10 times a day.     Duration of Dizziness  seconds was 2-30 minutes    Aggravating Factors Looking up to the ceiling   Relieving Factors Head stationary     Occulomotor Exam   Smooth Pursuits Saccades  Lt eye to the right strong; Left mild    Saccades Intact  Did have poor trajectory      Vestibulo-Occular Reflex   VOR 1 Head Only (x 1 viewing) negative    VOR 2 Head and Object (x 2 viewing) --  mild symptoms      Positional Testing   Dix-Hallpike Dix-Hallpike Right;Dix-Hallpike Left     Dix-Hallpike Right   Dix-Hallpike Right Duration --  20 seconds   Dix-Hallpike Right Symptoms No nystagmus;Left nystagmus     Dix-Hallpike Left   Dix-Hallpike Left Duration --  mild symptoms    Dix-Hallpike Left Symptoms No nystagmus                         PT Education - 07/04/16 0846    Education  provided Yes   Education Details emphasised the importance of completing 15 minutes of balance everyday.  Pt demonstrated tandem and single leg stance, discussed to add head turns, arm motion, reaching, activities such as brushing teeth when they become easy.   Pt may decreased to completing self canalith repostiioning activities to one to two times a week    Person(s) Educated Patient   Methods Explanation   Comprehension Verbalized understanding;Returned demonstration          PT Short Term Goals - 07/04/16 5993      PT SHORT TERM GOAL #1   Title Pt to verbalize that he is experiencing his symptoms 5x a day or less to decrease risk of fall    Time 2   Period Weeks   Status Achieved     PT SHORT TERM GOAL #2   Title Pt to states that he is able to come sit to stand without having any symptoms of dizziness.    Time 2   Period Weeks   Status Achieved           PT Long Term Goals - 07/04/16 0839      PT LONG TERM GOAL #1   Title Pt to state that he has not had symptoms of dizziness for the past week   one bout that lasted for seconds   Time 4   Period Weeks   Status Partially Met     PT LONG TERM GOAL #2   Title PT to be able to look up at items at [facility] without experiencing any dizziness    Time 4   Period Weeks   Status Achieved               Plan - 07/04/16 0849    Clinical Impression Statement Pt states that he has not had a bout of dizziness for quite awhile until yesterday.  Yesterday he looked up and experienced slight dizziness for only a few seconds.  Pt feels that he is ready to be discharge.  Goals have been met.  Smooth pursuit and [place] Dix tested both of which are negative now.  Recommend discharge.    Rehab Potential Good   PT Frequency 2x / week   PT Duration 4 weeks   PT Treatment/Interventions ADLs/Self  Care Home Management;Canalith Repostioning;Therapeutic exercise;Balance training;Patient/family education   PT Next Visit Plan Discharge.        Patient will benefit from skilled therapeutic intervention in order to improve the following deficits and impairments:  Dizziness  Visit Diagnosis: BPPV (benign paroxysmal positional vertigo), right       G-Codes - Jul 07, 2016 5015    Functional Assessment Tool Used (Outpatient Only) Number of times pt gets dizzy was 5-10 a day now once every 10 days and only for a moment.   Functional Limitation Other PT primary   Other PT Primary Goal Status (A6825) At least 20 percent but less than 40 percent impaired, limited or restricted   Other PT Primary Discharge Status [phone]) At least 1 percent but less than 20 percent impaired, limited or restricted      Problem List Patient Active Problem List   Diagnosis Date Noted  . Syncope 06/26/2016    Rayetta Humphrey, PT CLT [phone] 07/07/16, 8:57 AM  [facility] [address] [place], [place], [address] Phone: [phone]   Fax:  [phone]  Name: Jamyson Jirak MRN: [id number] Date of Birth: 1950/12/07  PHYSICAL THERAPY DISCHARGE SUMMARY  Visits from Start of Care: 6  Current functional level related to goals / functional outcomes: See above   Remaining deficits: Decreased balance    Education / Equipment: HEP Plan: Patient agrees to discharge.  Patient goals were met. Patient is being discharged due to meeting the stated rehab goals.  ?????       Rayetta Humphrey, [place] CLT [phone]

## 2016-07-06 ENCOUNTER — Ambulatory Visit (HOSPITAL_COMMUNITY): Payer: BLUE CROSS/BLUE SHIELD | Admitting: Physical Therapy

## 2016-07-11 ENCOUNTER — Encounter (HOSPITAL_COMMUNITY): Payer: BLUE CROSS/BLUE SHIELD | Admitting: Physical Therapy

## 2016-07-13 ENCOUNTER — Encounter (HOSPITAL_COMMUNITY): Payer: BLUE CROSS/BLUE SHIELD | Admitting: Physical Therapy

## 2016-07-19 ENCOUNTER — Ambulatory Visit
Admission: RE | Admit: 2016-07-19 | Discharge: 2016-07-19 | Disposition: A | Discharge status: Home / Self Care | Payer: BLUE CROSS/BLUE SHIELD | Source: Ambulatory Visit | Attending: Radiology | Admitting: Radiology

## 2016-07-19 DIAGNOSIS — I701 Atherosclerosis of renal artery: Secondary | ICD-10-CM | POA: Diagnosis not present

## 2016-07-19 DIAGNOSIS — Z6826 Body mass index (BMI) 26.0-26.9, adult: Secondary | ICD-10-CM | POA: Diagnosis not present

## 2016-07-19 DIAGNOSIS — L509 Urticaria, unspecified: Secondary | ICD-10-CM | POA: Diagnosis not present

## 2016-07-19 HISTORY — PX: IR RADIOLOGIST EVAL & MGMT: IMG5224

## 2016-07-19 NOTE — Progress Notes (Signed)
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 of 61 mm Hg and mean pressure gradient of 25 mm Hg at rest. After selective catheterization of the left renal artery, stenosis was estimated to be greater than 85% and critical. The 5 French catheter was occlusive and minimal flow was noted once a 5 French catheter was advanced beyond the orifice of the artery. Measured pressure in the artery after advancement of the 5 French catheter was only 4 mm Hg. Proximal stenosis of the right renal artery was treated with a 6 mm x 18 mm balloon mounted stent. After stent deployment, the artery was widely patent with excellent flow noted. Guidewire access was lost and the stent was not re- catheterized for pressure gradient measurement post stent placement due to excellent angiographic result. Proximal stenosis of the left renal artery was treated with a 6 mm x 15 mm balloon mounted stent. After stent deployment the artery is widely patent with excellent flow noted. Repeat pressure gradient measurement demonstrated no pressure gradient across the stented segment. IMPRESSION: 1. 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 The stenosis was successfully treated with intravascular stent placement. A 6 mm x 18 mm stent was placed and the artery noted to be widely patent after  stenting. 2. 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 A 5 French catheter was occlusive at the level of stenosis. The stenosis was successfully treated with a 6 mm x 15 mm stent. The artery was noted to be widely patent after stenting with no pressure gradient present after stent placement. 3. The patient will be started on Plavix 75 mg daily in addition to his 81 mg daily aspirin after renal artery stent placement. The first dose of Plavix will be given during recovery. He was given a prescription for daily Plavix for at least the next 6 months. Electronically Signed   By: Aletta Edouard M.D.   On: 06/26/2016 14:02   [facility] Stent Ea Add Art Not Therese Sarah Car Vert Car  Result Date: 06/26/2016 INDICATION:  Chronic kidney disease, stage IV with smaller right kidney compared to the left and duplex ultrasound demonstrating elevated velocities in the proximal to mid left renal artery. Prior consultation as been performed in the patient presents for CO2 arteriography with possible intervention to treat renal artery stenosis. EXAM: 1. BILATERAL SELECTIVE RENAL ARTERIOGRAPHY 2. INTRAVASCULAR STENT PLACEMENT IN RIGHT RENAL ARTERY 3. INTRAVASCULAR STENT PLACEMENT IN LEFT RENAL ARTERY MEDICATIONS: 3000 units intravenous heparin administered during the procedure. ANESTHESIA/SEDATION: Moderate (conscious) sedation was employed during this procedure. A total of Versed 8.0 mg and Fentanyl 200 mcg was administered intravenously. Moderate Sedation Time: 105 minutes. The patient's level of consciousness and vital signs were monitored continuously by radiology nursing throughout the procedure under my direct supervision. CONTRAST:  CO2 gas FLUOROSCOPY TIME:  Fluoroscopy Time: 22 minutes and 48 seconds. 293 mGy. COMPLICATIONS: None immediate. PROCEDURE: Informed consent was obtained from the patient following explanation of the procedure, risks, benefits and alternatives. The patient  understands, agrees and consents for the procedure. All questions were addressed. A time out was performed prior to the initiation of the procedure. Maximal barrier sterile technique utilized including caps, mask, sterile gowns, sterile gloves, large sterile drape, hand hygiene, and chlorhexidine prep. Ultrasound was used to confirm patency of the right common femoral artery. Under direct ultrasound guidance, access of the artery was performed with a 21 gauge needle. After access with a micropuncture set, a 5 [place] vascular sheath was advanced over a guidewire. A 5 French pigtail catheter was advanced into the abdominal aorta. CO2 arteriography was performed at the level of the mid abdominal aorta centered over the renal artery origins. A 5 French Cobra catheter was advanced into the abdominal aorta. This was used to selectively catheterize the right renal artery. Selective arteriography was performed with CO2. Pressure measurements were then obtained in the mid right renal artery and abdominal aorta to establish a right renal artery pressure gradient across the level of the proximal stenosis. The 5 [place] cobra catheter was then used to selectively catheterize the left renal artery. Selective arteriography was performed with CO2. Pressure measurement was obtained in the left renal artery through the catheter. The 5 French sheath was then exchanged over a guidewire for a 7 [place] guide sheath which was then advanced into the abdominal aorta. The right renal artery was then again selectively catheterized with the Cobra catheter. A 0.018 inch working wire was then advanced into the renal artery. A Palmaz Blue balloon mounted stent measuring 6 mm in diameter and 18 mm in length was chosen for placement in the right renal artery based on estimation of vessel caliber and stenosis length. After administration of IV heparin, the balloon mounted stent was advanced into the proximal right renal artery. Additional CO2  arteriography was performed through the guide sheath at the level of the abdominal aorta near the right renal artery orifice and additional adjustments in stent positioning performed. The stent was then deployed by inflating the deployment balloon to nominal pressure of 10 atmospheres. The balloon was then deflated and removed. Additional arteriography was performed through the guide sheath. The left renal artery was then selectively catheterized. A 0.018 inch working wire was then advanced into the left renal artery. A Palmaz Blue balloon mounted stent measuring 6 mm in diameter and 15 mm in length was chosen for placement in the left renal artery. This stent was advanced over a guidewire into the proximal left renal artery. Positioning arteriography was performed with CO2 through the guide sheath at  the level of the left renal artery orifice. The stent was then deployed with inflation of the deployment balloon to 10 atmospheres. The balloon was deflated and removed. Additional arteriography was performed through the guide sheath. A pull-back pressure gradient measurement was then obtained through a 5 French catheter across the stented segment. The guide sheath was replaced for a short 6 [place] sheath. Oblique arteriography of the right femoral access site was then performed with CO2. Arteriotomy closure was performed with the Cordis Exoseal device. FINDINGS: Initial CO2 arteriography demonstrates significant proximal disease involving both renal arteries with stenoses present of greater than 70% narrowing bilaterally. After selective catheterization of the right renal artery, stenosis was estimated to be approximately 75-80%. Pressure measurement showed a significant pressure gradient across the proximal right renal artery with a systolic pressure gradient of 61 mm Hg and mean pressure gradient of 25 mm Hg at rest. After selective catheterization of the left renal artery, stenosis was estimated to be greater than  85% and critical. The 5 French catheter was occlusive and minimal flow was noted once a 5 French catheter was advanced beyond the orifice of the artery. Measured pressure in the artery after advancement of the 5 French catheter was only 4 mm Hg. Proximal stenosis of the right renal artery was treated with a 6 mm x 18 mm balloon mounted stent. After stent deployment, the artery was widely patent with excellent flow noted. Guidewire access was lost and the stent was not re- catheterized for pressure gradient measurement post stent placement due to excellent angiographic result. Proximal stenosis of the left renal artery was treated with a 6 mm x 15 mm balloon mounted stent. After stent deployment the artery is widely patent with excellent flow noted. Repeat pressure gradient measurement demonstrated no pressure gradient across the stented segment. IMPRESSION: 1. 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 The stenosis was successfully treated with intravascular stent placement. A 6 mm x 18 mm stent was placed and the artery noted to be widely patent after stenting. 2. 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 A 5 French catheter was occlusive at the level of stenosis. The stenosis was successfully treated with a 6 mm x 15 mm stent. The artery was noted to be widely patent after stenting with no pressure gradient present after stent placement. 3. The patient will be started on Plavix 75 mg daily in addition to his 81 mg daily aspirin after renal artery stent placement. The first dose of Plavix will be given during recovery. He was given a prescription for daily Plavix for at least the next 6 months. Electronically Signed   By: Aletta Edouard M.D.   On: 06/26/2016 14:02    Labs:  CBC:  Recent Labs  06/26/16 0713 06/27/16 0457  WBC 5.9 7.2  HGB 12.1* 11.3*  HCT 34.8* 32.3*  PLT 147* 142*     COAGS:  Recent Labs  06/26/16 0713  INR 1.01  APTT 30    BMP:  Recent Labs  06/26/16 0713 06/27/16 0457  NA 138 139  K 3.8 3.7  CL 104 109  CO2 22 23  GLUCOSE 116* 105*  BUN 27* 22*  CALCIUM 9.7 9.1  CREATININE 2.21* 1.89*  GFRNONAA 29* 35*  GFRAA 33* 40*    LIVER FUNCTION TESTS:  Recent Labs  06/27/16 0457  BILITOT 0.9  AST 20  ALT 18  ALKPHOS 48  PROT 6.2*  ALBUMIN  3.7    Assessment and Plan:  Marcus Herrera is doing well after bilateral renal artery stent placement to treat significant bilateral renal artery stenoses. We reviewed imaging from the angiographic procedure today. Initial drop in blood pressure with symptomatic orthostasis was treated with IV hydration and holding Hyzaar. His blood pressure has been normal at home off of Hyzaar. Creatinine immediately following the procedure improved slightly from 2.21 to 1.89 overnight. Part of this is likely due to the additional IV hydration he received.  Marcus Herrera will follow-up with Dr. Posey Pronto for additional check of his renal function and blood pressure. I told him that I would not have to see him further unless there were signs clinically of potential restenosis of the renal arteries. At that time, renal duplex ultrasound would be initially the best study to perform. I have recommended that he remain on daily Plavix in addition to aspirin for 6 months after stent placement. After 6 months, the benefit of Plavix in the setting of renal artery stent placement is less clear and I will defer to Dr. Posey Pronto regarding his opinion about long-term Plavix therapy.   Electronically SignedAletta Edouard T 07/19/2016, 8:39 AM     I spent a total of 25 Minutes in face to face in clinical consultation, greater than 50% of which was counseling/coordinating care post bilateral renal artery stent placement.

## 2016-08-01 DIAGNOSIS — N183 Chronic kidney disease, stage 3 (moderate): Secondary | ICD-10-CM | POA: Diagnosis not present

## 2016-08-01 DIAGNOSIS — I10 Essential (primary) hypertension: Secondary | ICD-10-CM | POA: Diagnosis not present

## 2016-08-01 DIAGNOSIS — I701 Atherosclerosis of renal artery: Secondary | ICD-10-CM | POA: Diagnosis not present

## 2016-08-01 DIAGNOSIS — N2581 Secondary hyperparathyroidism of renal origin: Secondary | ICD-10-CM | POA: Diagnosis not present

## 2016-08-01 DIAGNOSIS — Z6826 Body mass index (BMI) 26.0-26.9, adult: Secondary | ICD-10-CM | POA: Diagnosis not present

## 2016-08-24 DIAGNOSIS — R739 Hyperglycemia, unspecified: Secondary | ICD-10-CM | POA: Diagnosis not present

## 2016-08-24 DIAGNOSIS — N189 Chronic kidney disease, unspecified: Secondary | ICD-10-CM | POA: Diagnosis not present

## 2016-08-24 DIAGNOSIS — N185 Chronic kidney disease, stage 5: Secondary | ICD-10-CM | POA: Diagnosis not present

## 2016-08-24 DIAGNOSIS — E7800 Pure hypercholesterolemia, unspecified: Secondary | ICD-10-CM | POA: Diagnosis not present

## 2016-08-24 DIAGNOSIS — I10 Essential (primary) hypertension: Secondary | ICD-10-CM | POA: Diagnosis not present

## 2016-09-29 ENCOUNTER — Encounter: Payer: Self-pay | Admitting: Radiology

## 2017-03-12 DIAGNOSIS — E7800 Pure hypercholesterolemia, unspecified: Secondary | ICD-10-CM | POA: Diagnosis not present

## 2017-03-12 DIAGNOSIS — R339 Retention of urine, unspecified: Secondary | ICD-10-CM | POA: Diagnosis not present

## 2017-03-12 DIAGNOSIS — Z79899 Other long term (current) drug therapy: Secondary | ICD-10-CM | POA: Diagnosis not present

## 2017-03-12 DIAGNOSIS — I129 Hypertensive chronic kidney disease with stage 1 through stage 4 chronic kidney disease, or unspecified chronic kidney disease: Secondary | ICD-10-CM | POA: Diagnosis not present

## 2017-03-12 DIAGNOSIS — N189 Chronic kidney disease, unspecified: Secondary | ICD-10-CM | POA: Diagnosis not present

## 2017-03-12 DIAGNOSIS — N410 Acute prostatitis: Secondary | ICD-10-CM | POA: Diagnosis not present

## 2017-04-23 DIAGNOSIS — D631 Anemia in chronic kidney disease: Secondary | ICD-10-CM | POA: Diagnosis not present

## 2017-04-23 DIAGNOSIS — N183 Chronic kidney disease, stage 3 (moderate): Secondary | ICD-10-CM | POA: Diagnosis not present

## 2017-04-23 DIAGNOSIS — N2581 Secondary hyperparathyroidism of renal origin: Secondary | ICD-10-CM | POA: Diagnosis not present

## 2017-04-25 DIAGNOSIS — Z6827 Body mass index (BMI) 27.0-27.9, adult: Secondary | ICD-10-CM | POA: Diagnosis not present

## 2017-04-25 DIAGNOSIS — I10 Essential (primary) hypertension: Secondary | ICD-10-CM | POA: Diagnosis not present

## 2017-04-25 DIAGNOSIS — E7800 Pure hypercholesterolemia, unspecified: Secondary | ICD-10-CM | POA: Diagnosis not present

## 2017-04-25 DIAGNOSIS — N189 Chronic kidney disease, unspecified: Secondary | ICD-10-CM | POA: Diagnosis not present

## 2017-04-25 DIAGNOSIS — N185 Chronic kidney disease, stage 5: Secondary | ICD-10-CM | POA: Diagnosis not present

## 2017-04-25 DIAGNOSIS — R739 Hyperglycemia, unspecified: Secondary | ICD-10-CM | POA: Diagnosis not present

## 2017-05-01 DIAGNOSIS — N183 Chronic kidney disease, stage 3 (moderate): Secondary | ICD-10-CM | POA: Diagnosis not present

## 2017-05-01 DIAGNOSIS — N2581 Secondary hyperparathyroidism of renal origin: Secondary | ICD-10-CM | POA: Diagnosis not present

## 2017-05-01 DIAGNOSIS — D631 Anemia in chronic kidney disease: Secondary | ICD-10-CM | POA: Diagnosis not present

## 2017-05-01 DIAGNOSIS — I129 Hypertensive chronic kidney disease with stage 1 through stage 4 chronic kidney disease, or unspecified chronic kidney disease: Secondary | ICD-10-CM | POA: Diagnosis not present

## 2017-05-07 DIAGNOSIS — M47816 Spondylosis without myelopathy or radiculopathy, lumbar region: Secondary | ICD-10-CM | POA: Diagnosis not present

## 2017-05-07 DIAGNOSIS — M5442 Lumbago with sciatica, left side: Secondary | ICD-10-CM | POA: Diagnosis not present

## 2017-05-07 DIAGNOSIS — M9903 Segmental and somatic dysfunction of lumbar region: Secondary | ICD-10-CM | POA: Diagnosis not present

## 2017-05-09 DIAGNOSIS — M5442 Lumbago with sciatica, left side: Secondary | ICD-10-CM | POA: Diagnosis not present

## 2017-05-09 DIAGNOSIS — M47816 Spondylosis without myelopathy or radiculopathy, lumbar region: Secondary | ICD-10-CM | POA: Diagnosis not present

## 2017-05-09 DIAGNOSIS — M9903 Segmental and somatic dysfunction of lumbar region: Secondary | ICD-10-CM | POA: Diagnosis not present

## 2017-05-11 DIAGNOSIS — M47816 Spondylosis without myelopathy or radiculopathy, lumbar region: Secondary | ICD-10-CM | POA: Diagnosis not present

## 2017-05-11 DIAGNOSIS — M5442 Lumbago with sciatica, left side: Secondary | ICD-10-CM | POA: Diagnosis not present

## 2017-05-11 DIAGNOSIS — M9903 Segmental and somatic dysfunction of lumbar region: Secondary | ICD-10-CM | POA: Diagnosis not present

## 2017-05-14 DIAGNOSIS — M47816 Spondylosis without myelopathy or radiculopathy, lumbar region: Secondary | ICD-10-CM | POA: Diagnosis not present

## 2017-05-14 DIAGNOSIS — M5442 Lumbago with sciatica, left side: Secondary | ICD-10-CM | POA: Diagnosis not present

## 2017-05-14 DIAGNOSIS — M9903 Segmental and somatic dysfunction of lumbar region: Secondary | ICD-10-CM | POA: Diagnosis not present

## 2017-05-16 DIAGNOSIS — M9903 Segmental and somatic dysfunction of lumbar region: Secondary | ICD-10-CM | POA: Diagnosis not present

## 2017-05-16 DIAGNOSIS — M5442 Lumbago with sciatica, left side: Secondary | ICD-10-CM | POA: Diagnosis not present

## 2017-05-16 DIAGNOSIS — M47816 Spondylosis without myelopathy or radiculopathy, lumbar region: Secondary | ICD-10-CM | POA: Diagnosis not present

## 2017-05-18 DIAGNOSIS — M5442 Lumbago with sciatica, left side: Secondary | ICD-10-CM | POA: Diagnosis not present

## 2017-05-18 DIAGNOSIS — M9903 Segmental and somatic dysfunction of lumbar region: Secondary | ICD-10-CM | POA: Diagnosis not present

## 2017-05-18 DIAGNOSIS — M47816 Spondylosis without myelopathy or radiculopathy, lumbar region: Secondary | ICD-10-CM | POA: Diagnosis not present

## 2017-05-25 DIAGNOSIS — M47816 Spondylosis without myelopathy or radiculopathy, lumbar region: Secondary | ICD-10-CM | POA: Diagnosis not present

## 2017-05-25 DIAGNOSIS — M5442 Lumbago with sciatica, left side: Secondary | ICD-10-CM | POA: Diagnosis not present

## 2017-05-25 DIAGNOSIS — M9903 Segmental and somatic dysfunction of lumbar region: Secondary | ICD-10-CM | POA: Diagnosis not present

## 2017-06-01 DIAGNOSIS — M9903 Segmental and somatic dysfunction of lumbar region: Secondary | ICD-10-CM | POA: Diagnosis not present

## 2017-06-01 DIAGNOSIS — M5442 Lumbago with sciatica, left side: Secondary | ICD-10-CM | POA: Diagnosis not present

## 2017-06-01 DIAGNOSIS — M47816 Spondylosis without myelopathy or radiculopathy, lumbar region: Secondary | ICD-10-CM | POA: Diagnosis not present

## 2017-06-08 DIAGNOSIS — M5442 Lumbago with sciatica, left side: Secondary | ICD-10-CM | POA: Diagnosis not present

## 2017-06-08 DIAGNOSIS — M47816 Spondylosis without myelopathy or radiculopathy, lumbar region: Secondary | ICD-10-CM | POA: Diagnosis not present

## 2017-06-08 DIAGNOSIS — M9903 Segmental and somatic dysfunction of lumbar region: Secondary | ICD-10-CM | POA: Diagnosis not present

## 2017-06-14 DIAGNOSIS — M47816 Spondylosis without myelopathy or radiculopathy, lumbar region: Secondary | ICD-10-CM | POA: Diagnosis not present

## 2017-06-14 DIAGNOSIS — M9903 Segmental and somatic dysfunction of lumbar region: Secondary | ICD-10-CM | POA: Diagnosis not present

## 2017-06-14 DIAGNOSIS — M5442 Lumbago with sciatica, left side: Secondary | ICD-10-CM | POA: Diagnosis not present

## 2017-06-21 DIAGNOSIS — M5442 Lumbago with sciatica, left side: Secondary | ICD-10-CM | POA: Diagnosis not present

## 2017-06-21 DIAGNOSIS — M9903 Segmental and somatic dysfunction of lumbar region: Secondary | ICD-10-CM | POA: Diagnosis not present

## 2017-06-21 DIAGNOSIS — M47816 Spondylosis without myelopathy or radiculopathy, lumbar region: Secondary | ICD-10-CM | POA: Diagnosis not present

## 2017-07-05 DIAGNOSIS — M5442 Lumbago with sciatica, left side: Secondary | ICD-10-CM | POA: Diagnosis not present

## 2017-07-05 DIAGNOSIS — M47816 Spondylosis without myelopathy or radiculopathy, lumbar region: Secondary | ICD-10-CM | POA: Diagnosis not present

## 2017-07-05 DIAGNOSIS — M9903 Segmental and somatic dysfunction of lumbar region: Secondary | ICD-10-CM | POA: Diagnosis not present

## 2017-07-19 DIAGNOSIS — M47816 Spondylosis without myelopathy or radiculopathy, lumbar region: Secondary | ICD-10-CM | POA: Diagnosis not present

## 2017-07-19 DIAGNOSIS — M9903 Segmental and somatic dysfunction of lumbar region: Secondary | ICD-10-CM | POA: Diagnosis not present

## 2017-07-19 DIAGNOSIS — M5442 Lumbago with sciatica, left side: Secondary | ICD-10-CM | POA: Diagnosis not present

## 2017-09-13 DIAGNOSIS — M9903 Segmental and somatic dysfunction of lumbar region: Secondary | ICD-10-CM | POA: Diagnosis not present

## 2017-09-13 DIAGNOSIS — M5442 Lumbago with sciatica, left side: Secondary | ICD-10-CM | POA: Diagnosis not present

## 2017-09-13 DIAGNOSIS — M47816 Spondylosis without myelopathy or radiculopathy, lumbar region: Secondary | ICD-10-CM | POA: Diagnosis not present

## 2017-10-25 IMAGING — US IR TRANSCATH PLC STENT EA ADD ART NOT LE CV CAR VERT CAR
1 series · 1 of 1 positions shown · non-contrast
Comparison: none

INDICATION: Chronic kidney disease, stage IV with smaller right kidney compared
to the left and duplex ultrasound demonstrating elevated velocities
in the proximal to mid left renal artery. Prior consultation as been
performed in the patient presents for CO2 arteriography with
possible intervention to treat renal artery stenosis.

[Series 1: ir (id) (id)/(id)/(id) ir · 1 of 1 slices shown]
[im 1/1]
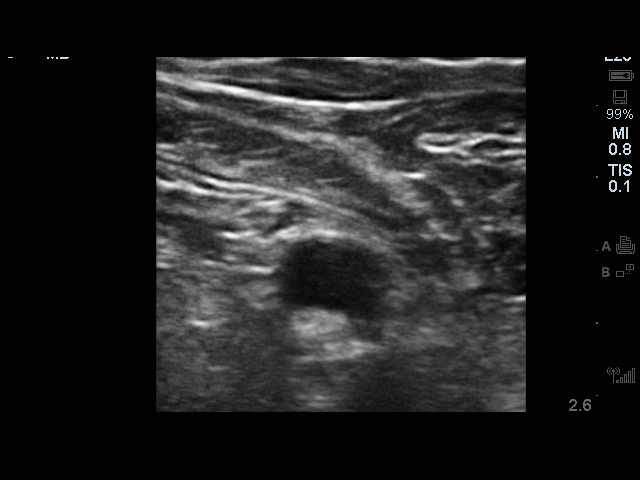

[1 of 1 positions shown; findings below may reference images not displayed]

EXAM:
1. BILATERAL SELECTIVE RENAL ARTERIOGRAPHY
2. INTRAVASCULAR STENT PLACEMENT IN RIGHT RENAL ARTERY
3. INTRAVASCULAR STENT PLACEMENT IN LEFT RENAL ARTERY

MEDICATIONS:
8000 units intravenous heparin administered during the procedure.

ANESTHESIA/SEDATION:
Moderate (conscious) sedation was employed during this procedure.

A total of Versed 8.0 mg and Fentanyl 200 mcg was administered
intravenously.

Moderate Sedation Time: 105 minutes. The patient's level of
consciousness and vital signs were monitored continuously by
radiology nursing throughout the procedure under my direct
supervision.

CONTRAST:  CO2 gas

FLUOROSCOPY TIME:  Fluoroscopy Time: 22 minutes and 48 seconds. 293
mGy.

COMPLICATIONS:
None immediate.



Ultrasound was used to confirm patency of the right common femoral
artery. Under direct ultrasound guidance, access of the artery was
performed with a 21 gauge needle. After access with a micropuncture
[DATE] French vascular sheath was advanced over a guidewire.

A 5 French pigtail catheter was advanced into the abdominal aorta.
CO2 arteriography was performed at the level of the mid abdominal
aorta centered over the renal artery origins.

A 5 French Cobra catheter was advanced into the abdominal aorta.
This was used to selectively catheterize the right renal artery.
Selective arteriography was performed with CO2. Pressure
measurements were then obtained in the mid right renal artery and
abdominal aorta to establish a right renal artery pressure gradient
across the level of the proximal stenosis.

The 5 French cobra catheter was then used to selectively catheterize
the left renal artery. Selective arteriography was performed with
CO2. Pressure measurement was obtained in the left renal artery
through the catheter.

The 5 French sheath was then exchanged over a guidewire for a 7
French guide sheath which was then advanced into the abdominal
aorta. The right renal artery was then again selectively
catheterized with the Cobra catheter. A 0.018 inch working wire was
then advanced into the renal artery.

A Palmaz Blue balloon mounted stent measuring 6 mm in diameter and
18 mm in length was chosen for placement in the right renal artery
based on estimation of vessel caliber and stenosis length. After
administration of IV heparin, the balloon mounted stent was advanced
into the proximal right renal artery. Additional CO2 arteriography
was performed through the guide sheath at the level of the abdominal
aorta near the right renal artery orifice and additional adjustments
in stent positioning performed. The stent was then deployed by
inflating the deployment balloon to nominal pressure of 10
atmospheres. The balloon was then deflated and removed. Additional
arteriography was performed through the guide sheath.

The left renal artery was then selectively catheterized. A
inch working wire was then advanced into the left renal artery. A
Palmaz Blue balloon mounted stent measuring 6 mm in diameter and 15
mm in length was chosen for placement in the left renal artery. This
stent was advanced over a guidewire into the proximal left renal
artery. Positioning arteriography was performed with CO2 through the
guide sheath at the level of the left renal artery orifice. The
stent was then deployed with inflation of the deployment balloon to
10 atmospheres. The balloon was deflated and removed. Additional
arteriography was performed through the guide sheath. A pull-back
pressure gradient measurement was then obtained through a 5 French
catheter across the stented segment.

The guide sheath was replaced for a short 6 French sheath. Oblique
arteriography of the right femoral access site was then performed
with CO2. Arteriotomy closure was performed with the Cordis Exoseal
device.
FINDINGS: Initial CO2 arteriography demonstrates significant proximal disease
involving both renal arteries with stenoses present of greater than
70% narrowing bilaterally. After selective catheterization of the
right renal artery, stenosis was estimated to be approximately
75-80%. Pressure measurement showed a significant pressure gradient
across the proximal right renal artery with a systolic pressure
gradient of 61 mm Hg and mean pressure gradient of 25 mm Hg at rest.

After selective catheterization of the left renal artery, stenosis
was estimated to be greater than 85% and critical. The 5 French
catheter was occlusive and minimal flow was noted once a 5 French
catheter was advanced beyond the orifice of the artery. Measured
pressure in the artery after advancement of the 5 French catheter
was only 4 mm Hg.

Proximal stenosis of the right renal artery was treated with a 6 mm
x 18 mm balloon mounted stent. After stent deployment, the artery
was widely patent with excellent flow noted. Guidewire access was
lost and the stent was not re- catheterized for pressure gradient
measurement post stent placement due to excellent angiographic
result.

Proximal stenosis of the left renal artery was treated with a 6 mm x
15 mm balloon mounted stent. After stent deployment the artery is
widely patent with excellent flow noted. Repeat pressure gradient
measurement demonstrated no pressure gradient across the stented
segment.
IMPRESSION: 1. Significant proximal right renal artery stenosis of approximately
75-80% with associated significant pressure gradient. The stenosis
was successfully treated with intravascular stent placement. A 6 mm
x 18 mm stent was placed and the artery noted to be widely patent
after stenting.
2. Severe proximal left renal artery stenosis of greater than 85%
narrowing. A 5 French catheter was occlusive at the level of
stenosis. The stenosis was successfully treated with a 6 mm x 15 mm
stent. The artery was noted to be widely patent after stenting with
no pressure gradient present after stent placement.
3. The patient will be started on Plavix 75 mg daily in addition to
his 81 mg daily aspirin after renal artery stent placement. The
first dose of Plavix will be given during recovery. He was given a
prescription for daily Plavix for at least the next 6 months.

## 2017-11-23 ENCOUNTER — Other Ambulatory Visit: Payer: Self-pay | Admitting: *Deleted

## 2017-11-23 NOTE — Patient Outreach (Signed)
[place] [facility]) Care Management  11/23/2017  Kasra Melvin December 26, 1950 [id number]  [facility] CM HTA/HRA screening call  Successful telephone outreach to [employer], 67 y/o male with history including but not limited to, .   PCP: Dr. Rory Percy, [facility] in [place]   HIPAA/ identity verified during phone call today, and purpose of call was discussed with patient.  Patient reports he was returning my call from voice mail left for him yesterday.  Patient sounds to be in no apparent distress throughout phone call today.   Patient reports today: -- has not had recent PCP visit: stated "no need to go until my annual appointment." Unsure when he will make appointment to see PCP for annual exam  -- denies need for transportation services- drives self to provider appointments -- independently manages self-care for ADL's and IADL's -- no recent falls; no current use of/ need for DME in home -- verbalizes a good understanding of his current medications; takes "about 4" oral medications and self-manages both without assistance from others; denies concerns with affording medications/ side effects from medications -- does not have HCPOA or living will; basics of Advance Directive (AD) planning discussed with patient; patient agreeable to my placing referral to BSW for AD packet/ educaiton -- declines need for ongoing outreach from [facility] team, stating that he "is doing fine, has no needs"   Discussed [facility] CM services available to patient through his insurance provider; patient declines need for services at this time, however, is agreeable to my placing [facility] CM packet/ nurse advice line magnet, and BSW referral for AD planning; encouraged patient to contact [facility] CM in the future if needs arise, and patient verbalizes agreement.   Plan:   Will place [facility] CM welcome packet in mail to patient   Will place referral to[facility] BSW re: patient's request  to place Advanced Directive  planning packet in mail to him for his review  Oneta Rack, RN, BSN, [employer] Coordinator [facility] Care Management  [phone]

## 2017-11-26 ENCOUNTER — Other Ambulatory Visit: Payer: Self-pay

## 2017-11-26 NOTE — Patient Outreach (Signed)
[facility] [facility]) Care Management  11/26/2017  Avelardo Reesman 1951-03-06 [id number]   Initial outreach to patient regarding social work referral for assistance with Advance Directives.  BSW left voicemail messages on home and mobile numbers.  Unsuccessful outreach letter mailed.  Will make another attempt within four business days.  Ronn Melena, BSW Social Worker [phone]

## 2017-11-29 ENCOUNTER — Ambulatory Visit: Payer: Self-pay

## 2017-11-29 ENCOUNTER — Other Ambulatory Visit: Payer: Self-pay

## 2017-11-29 NOTE — Patient Outreach (Signed)
[place] [facility]) Care Management  11/29/2017  Marcus Herrera [birth year] [id number]   Second outreach to patient regarding social work referral for assistance with Advance Directives.  BSW left voicemail messages on home and mobile numbers. Will make another attempt within four business days.  Ronn Melena, BSW Social Worker [phone]

## 2017-12-03 ENCOUNTER — Other Ambulatory Visit: Payer: Self-pay

## 2017-12-03 ENCOUNTER — Ambulatory Visit: Payer: Self-pay

## 2017-12-03 NOTE — Patient Outreach (Signed)
[name] [facility]) Care Management  12/03/2017  Marcus Herrera March 10, 1951 [id number]   Final outreach attempt regarding social work referral for assistance with Advance Directives.  BSW left voicemail message.  Will close case if no return call by the end of the week.  Ronn Melena, BSW Social Worker [phone]

## 2017-12-04 DIAGNOSIS — N2581 Secondary hyperparathyroidism of renal origin: Secondary | ICD-10-CM | POA: Diagnosis not present

## 2017-12-04 DIAGNOSIS — N183 Chronic kidney disease, stage 3 (moderate): Secondary | ICD-10-CM | POA: Diagnosis not present

## 2017-12-06 DIAGNOSIS — M5442 Lumbago with sciatica, left side: Secondary | ICD-10-CM | POA: Diagnosis not present

## 2017-12-06 DIAGNOSIS — M9903 Segmental and somatic dysfunction of lumbar region: Secondary | ICD-10-CM | POA: Diagnosis not present

## 2017-12-06 DIAGNOSIS — M47816 Spondylosis without myelopathy or radiculopathy, lumbar region: Secondary | ICD-10-CM | POA: Diagnosis not present

## 2017-12-07 ENCOUNTER — Other Ambulatory Visit: Payer: Self-pay

## 2017-12-07 DIAGNOSIS — N2581 Secondary hyperparathyroidism of renal origin: Secondary | ICD-10-CM | POA: Diagnosis not present

## 2017-12-07 DIAGNOSIS — D631 Anemia in chronic kidney disease: Secondary | ICD-10-CM | POA: Diagnosis not present

## 2017-12-07 DIAGNOSIS — I129 Hypertensive chronic kidney disease with stage 1 through stage 4 chronic kidney disease, or unspecified chronic kidney disease: Secondary | ICD-10-CM | POA: Diagnosis not present

## 2017-12-07 DIAGNOSIS — N183 Chronic kidney disease, stage 3 (moderate): Secondary | ICD-10-CM | POA: Diagnosis not present

## 2017-12-07 NOTE — Patient Outreach (Signed)
[facility]) Care Management  12/07/2017  Cliff Damiani [birth year] [id number]   [facility] BSW closing case for referral for assistance with Advance Directives due to being unable to contact patient.   Ronn Melena, BSW Social Worker [phone]

## 2018-01-10 DIAGNOSIS — M9903 Segmental and somatic dysfunction of lumbar region: Secondary | ICD-10-CM | POA: Diagnosis not present

## 2018-01-10 DIAGNOSIS — M5442 Lumbago with sciatica, left side: Secondary | ICD-10-CM | POA: Diagnosis not present

## 2018-01-10 DIAGNOSIS — M47816 Spondylosis without myelopathy or radiculopathy, lumbar region: Secondary | ICD-10-CM | POA: Diagnosis not present

## 2018-02-07 DIAGNOSIS — M5442 Lumbago with sciatica, left side: Secondary | ICD-10-CM | POA: Diagnosis not present

## 2018-02-07 DIAGNOSIS — M9903 Segmental and somatic dysfunction of lumbar region: Secondary | ICD-10-CM | POA: Diagnosis not present

## 2018-02-07 DIAGNOSIS — M47816 Spondylosis without myelopathy or radiculopathy, lumbar region: Secondary | ICD-10-CM | POA: Diagnosis not present

## 2018-02-20 DIAGNOSIS — L820 Inflamed seborrheic keratosis: Secondary | ICD-10-CM | POA: Diagnosis not present

## 2018-02-20 DIAGNOSIS — D485 Neoplasm of uncertain behavior of skin: Secondary | ICD-10-CM | POA: Diagnosis not present

## 2018-02-20 DIAGNOSIS — D225 Melanocytic nevi of trunk: Secondary | ICD-10-CM | POA: Diagnosis not present

## 2018-02-20 DIAGNOSIS — L570 Actinic keratosis: Secondary | ICD-10-CM | POA: Diagnosis not present

## 2018-02-22 DIAGNOSIS — Z23 Encounter for immunization: Secondary | ICD-10-CM | POA: Diagnosis not present

## 2018-03-07 DIAGNOSIS — M5442 Lumbago with sciatica, left side: Secondary | ICD-10-CM | POA: Diagnosis not present

## 2018-03-07 DIAGNOSIS — M9903 Segmental and somatic dysfunction of lumbar region: Secondary | ICD-10-CM | POA: Diagnosis not present

## 2018-03-07 DIAGNOSIS — M47816 Spondylosis without myelopathy or radiculopathy, lumbar region: Secondary | ICD-10-CM | POA: Diagnosis not present

## 2018-03-15 DIAGNOSIS — E7800 Pure hypercholesterolemia, unspecified: Secondary | ICD-10-CM | POA: Diagnosis not present

## 2018-03-15 DIAGNOSIS — N185 Chronic kidney disease, stage 5: Secondary | ICD-10-CM | POA: Diagnosis not present

## 2018-03-15 DIAGNOSIS — N410 Acute prostatitis: Secondary | ICD-10-CM | POA: Diagnosis not present

## 2018-03-15 DIAGNOSIS — I10 Essential (primary) hypertension: Secondary | ICD-10-CM | POA: Diagnosis not present

## 2018-03-15 DIAGNOSIS — R739 Hyperglycemia, unspecified: Secondary | ICD-10-CM | POA: Diagnosis not present

## 2018-03-16 DIAGNOSIS — E7800 Pure hypercholesterolemia, unspecified: Secondary | ICD-10-CM | POA: Diagnosis not present

## 2018-03-16 DIAGNOSIS — N185 Chronic kidney disease, stage 5: Secondary | ICD-10-CM | POA: Diagnosis not present

## 2018-03-16 DIAGNOSIS — I10 Essential (primary) hypertension: Secondary | ICD-10-CM | POA: Diagnosis not present

## 2018-03-19 DIAGNOSIS — Z0000 Encounter for general adult medical examination without abnormal findings: Secondary | ICD-10-CM | POA: Diagnosis not present

## 2018-03-19 DIAGNOSIS — Z6827 Body mass index (BMI) 27.0-27.9, adult: Secondary | ICD-10-CM | POA: Diagnosis not present

## 2018-04-04 DIAGNOSIS — M9903 Segmental and somatic dysfunction of lumbar region: Secondary | ICD-10-CM | POA: Diagnosis not present

## 2018-04-04 DIAGNOSIS — M47816 Spondylosis without myelopathy or radiculopathy, lumbar region: Secondary | ICD-10-CM | POA: Diagnosis not present

## 2018-04-04 DIAGNOSIS — M5442 Lumbago with sciatica, left side: Secondary | ICD-10-CM | POA: Diagnosis not present

## 2018-04-15 DIAGNOSIS — R7303 Prediabetes: Secondary | ICD-10-CM | POA: Diagnosis not present

## 2018-04-15 DIAGNOSIS — I10 Essential (primary) hypertension: Secondary | ICD-10-CM | POA: Diagnosis not present

## 2018-05-02 DIAGNOSIS — M5442 Lumbago with sciatica, left side: Secondary | ICD-10-CM | POA: Diagnosis not present

## 2018-05-02 DIAGNOSIS — M47816 Spondylosis without myelopathy or radiculopathy, lumbar region: Secondary | ICD-10-CM | POA: Diagnosis not present

## 2018-05-02 DIAGNOSIS — M9903 Segmental and somatic dysfunction of lumbar region: Secondary | ICD-10-CM | POA: Diagnosis not present

## 2018-05-16 DIAGNOSIS — I10 Essential (primary) hypertension: Secondary | ICD-10-CM | POA: Diagnosis not present

## 2018-05-16 DIAGNOSIS — E7800 Pure hypercholesterolemia, unspecified: Secondary | ICD-10-CM | POA: Diagnosis not present

## 2018-07-15 DIAGNOSIS — N183 Chronic kidney disease, stage 3 (moderate): Secondary | ICD-10-CM | POA: Diagnosis not present

## 2018-07-22 DIAGNOSIS — N2581 Secondary hyperparathyroidism of renal origin: Secondary | ICD-10-CM | POA: Diagnosis not present

## 2018-07-22 DIAGNOSIS — I129 Hypertensive chronic kidney disease with stage 1 through stage 4 chronic kidney disease, or unspecified chronic kidney disease: Secondary | ICD-10-CM | POA: Diagnosis not present

## 2018-07-22 DIAGNOSIS — D631 Anemia in chronic kidney disease: Secondary | ICD-10-CM | POA: Diagnosis not present

## 2018-07-22 DIAGNOSIS — N183 Chronic kidney disease, stage 3 (moderate): Secondary | ICD-10-CM | POA: Diagnosis not present

## 2018-07-30 DIAGNOSIS — E039 Hypothyroidism, unspecified: Secondary | ICD-10-CM | POA: Diagnosis not present

## 2018-07-30 DIAGNOSIS — Z1231 Encounter for screening mammogram for malignant neoplasm of breast: Secondary | ICD-10-CM | POA: Diagnosis not present

## 2018-07-30 DIAGNOSIS — G4733 Obstructive sleep apnea (adult) (pediatric): Secondary | ICD-10-CM | POA: Diagnosis not present

## 2018-07-30 DIAGNOSIS — E7800 Pure hypercholesterolemia, unspecified: Secondary | ICD-10-CM | POA: Diagnosis not present

## 2018-08-15 DIAGNOSIS — I10 Essential (primary) hypertension: Secondary | ICD-10-CM | POA: Diagnosis not present

## 2018-08-15 DIAGNOSIS — J449 Chronic obstructive pulmonary disease, unspecified: Secondary | ICD-10-CM | POA: Diagnosis not present

## 2018-08-15 DIAGNOSIS — E7800 Pure hypercholesterolemia, unspecified: Secondary | ICD-10-CM | POA: Diagnosis not present

## 2018-09-04 IMAGING — US US RENAL ARTERY STENOSIS
1 series · 13 of 25 positions shown · non-contrast
Comparison: None.

CLINICAL DATA: Hypertension, chronic kidney disease stage 4

EXAM:
RENAL/URINARY TRACT ULTRASOUND
RENAL DUPLEX DOPPLER ULTRASOUND

[Series 1: us renal artery stenosis · 0.23mm/px · 13 of 71 slices shown]
[im 1/71]
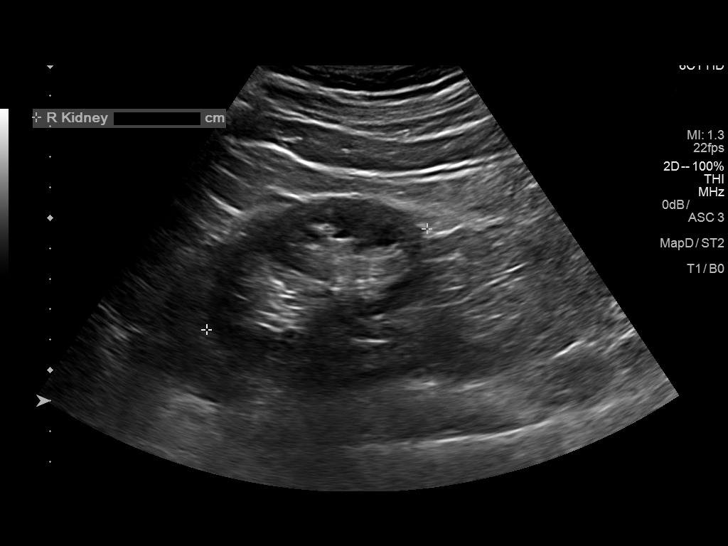
[im 6/71]
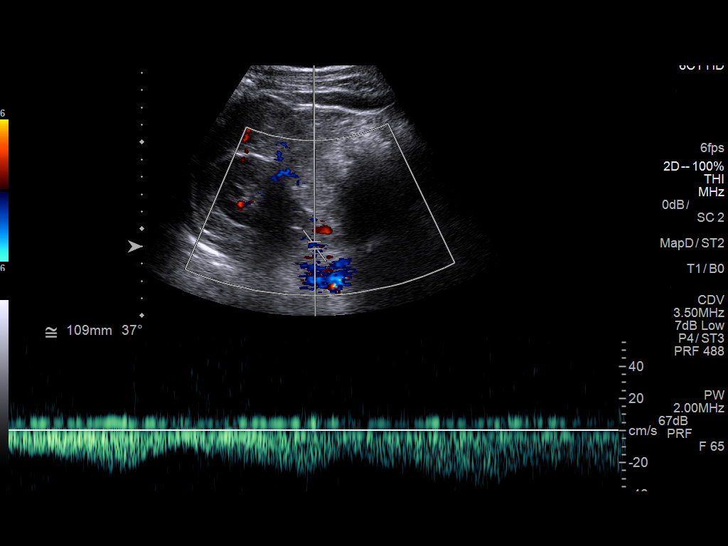
[im 12/71]
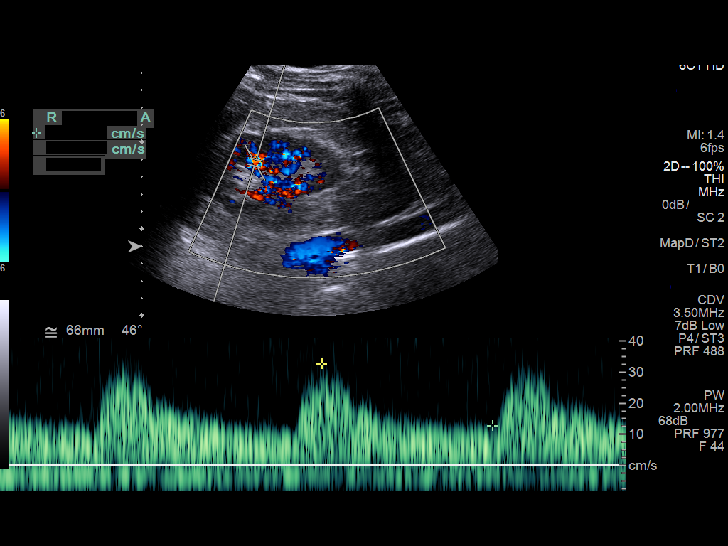
[im 18/71]
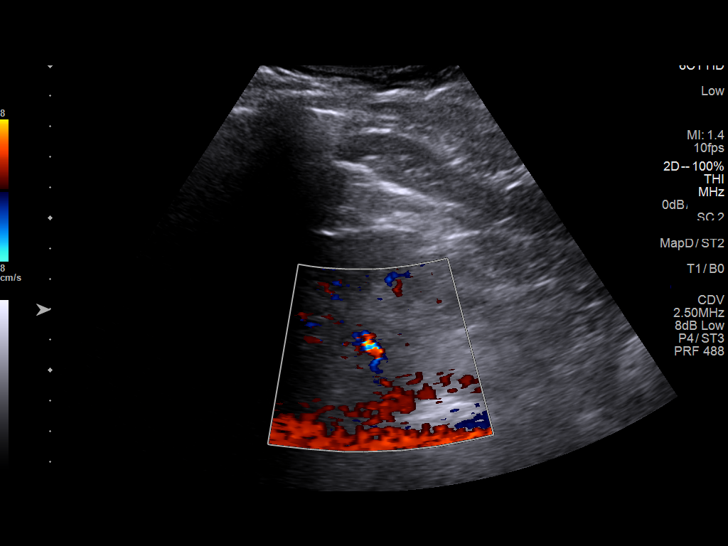
[im 24/71]
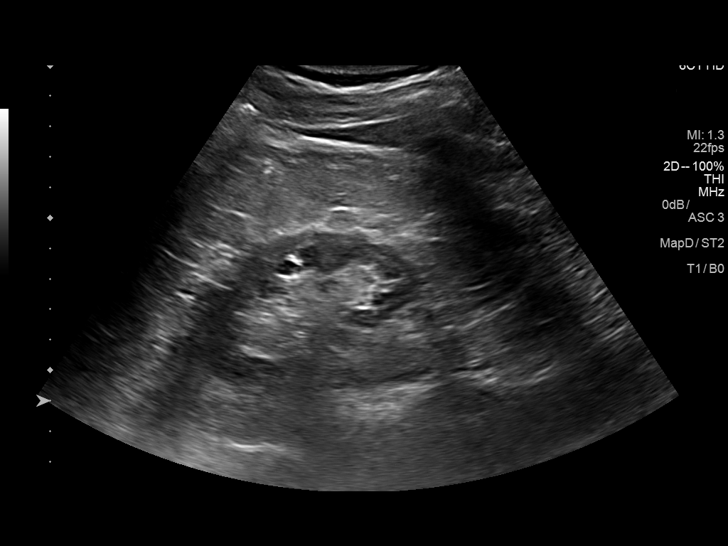
[im 30/71]
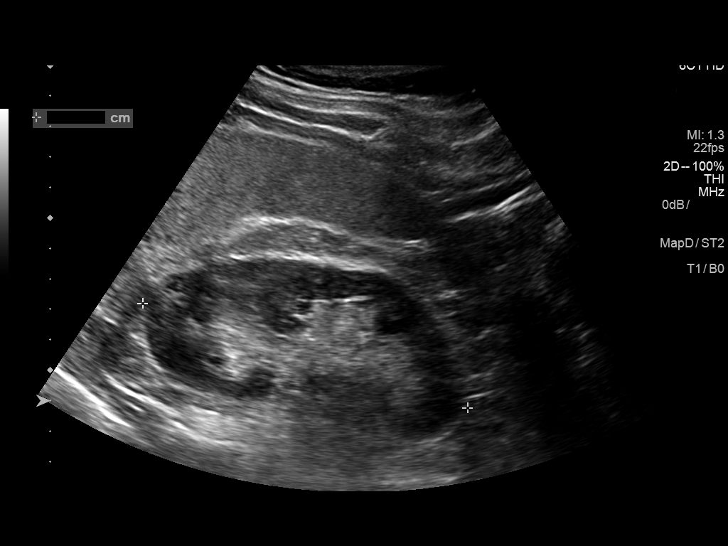
[im 36/71]
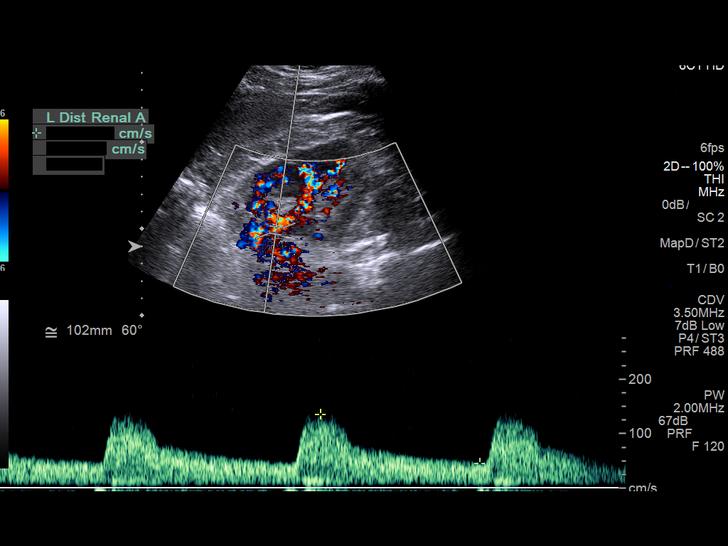
[im 41/71]
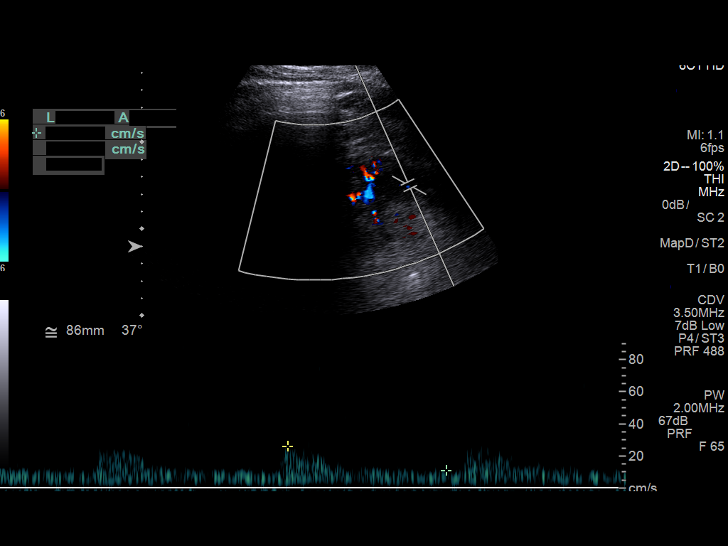
[im 47/71]
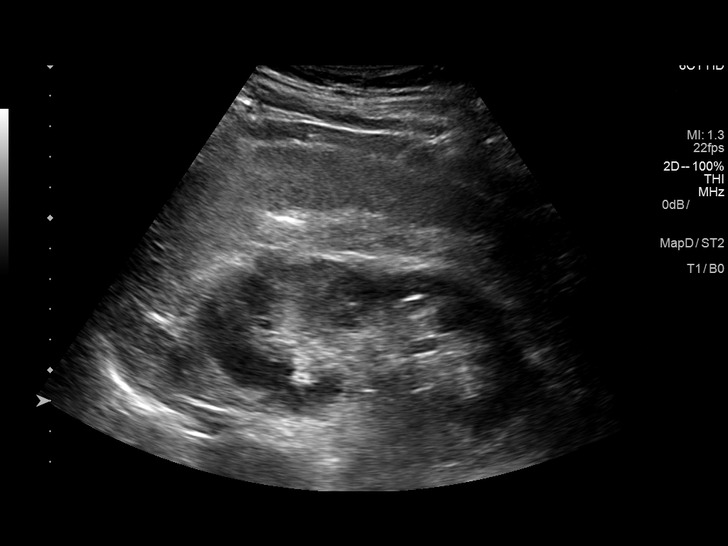
[im 53/71]
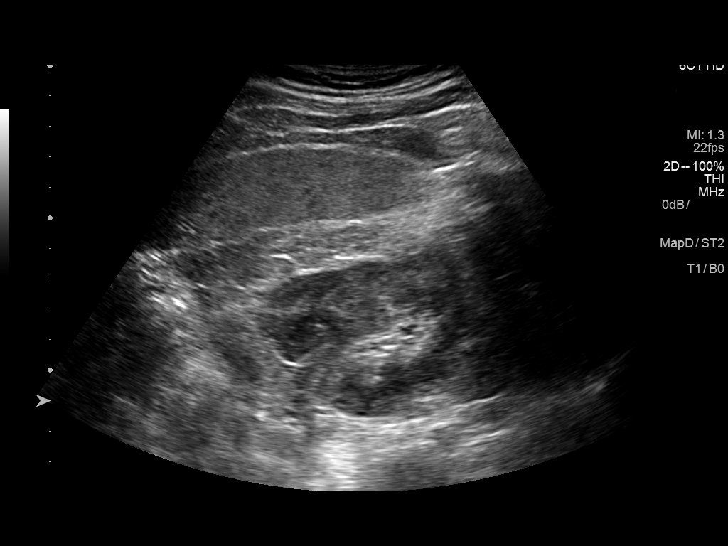
[im 59/71]
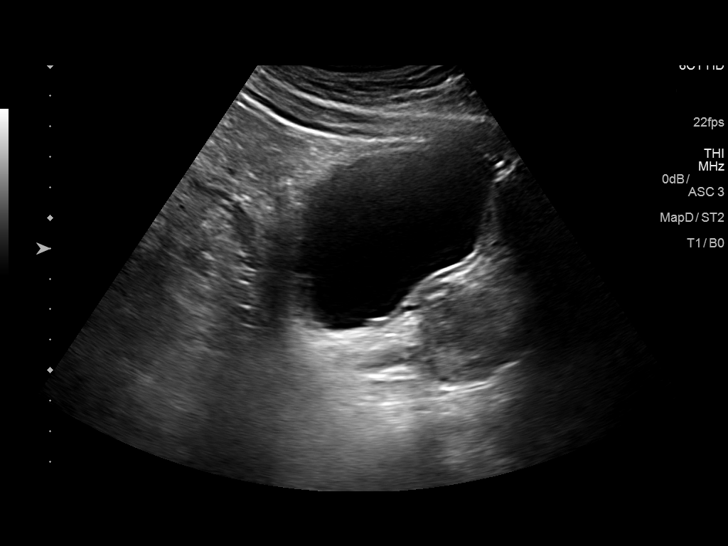
[im 65/71]
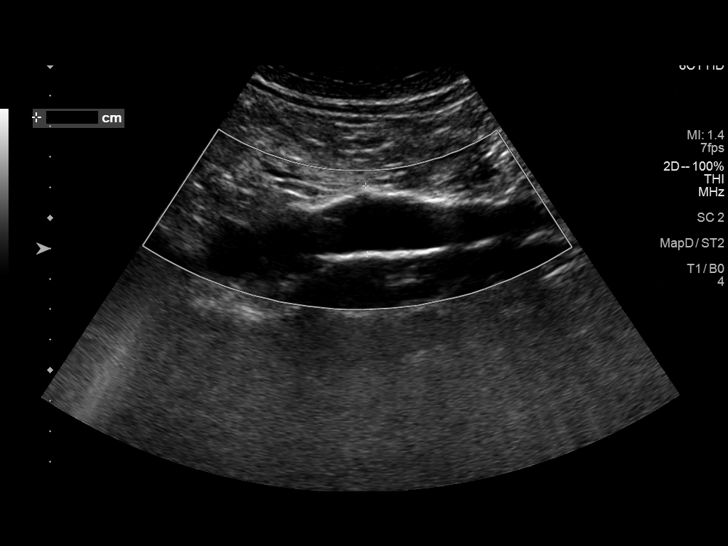
[im 71/71]
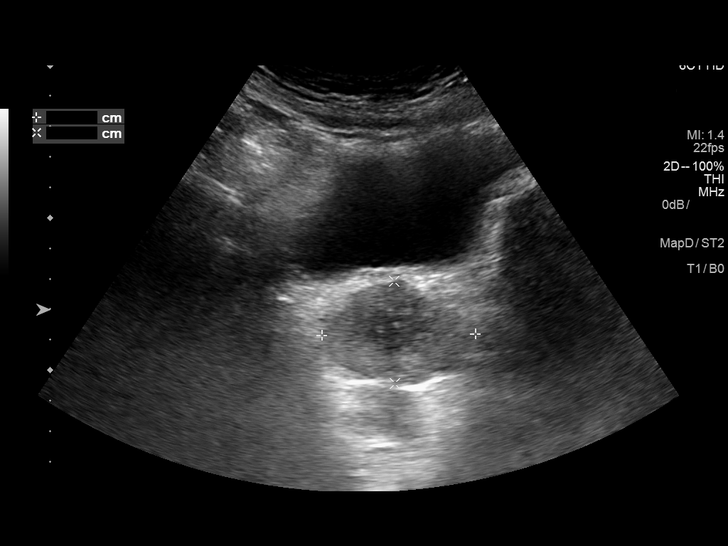

[13 of 25 positions shown; findings below may reference images not displayed]

FINDINGS: Right Kidney:

Length: 8 cm. Echogenicity within normal limits. 3 mm shadowing
focus in the mid renal collecting system. No mass or hydronephrosis
visualized.

Left Kidney:

Length: 11.2 cm. Echogenicity within normal limits. No mass or
hydronephrosis visualized.

Bladder:  Unremarkable.

Prostatic enlargement 4.6 x 3.4 x 5 cm.

RENAL DUPLEX ULTRASOUND

Right Renal Artery Velocities:

Origin:  70 cm/sec

Mid:  100 cm/sec

Hilum:  51 cm/sec

Interlobar:  25 cm/sec

Arcuate:  15 Cm/sec

Left Renal Artery Velocities:

Origin:  226 cm/sec

Mid:  280 cm/sec

Hilum:  136 cm/sec

Interlobar:  50 cm/sec

Arcuate:  26 cm/sec

Aortic Velocity:  112 Cm/sec

Right Renal-Aortic Ratios:

Origin:

Mid:

Hilum:

Interlobar:

Arcuate:

Left Renal-Aortic Ratios:

Origin:

Mid:

Hilum:

Interlobar:

Arcuate:
IMPRESSION: 1. Elevated peak systolic velocities in the proximal left renal
artery suggesting hemodynamically significant stenosis. Consider
catheter angiography for further evaluation, as the lesion may be
amenable to percutaneous treatment if confirmed to be significant.
2. Right renal parenchymal atrophy.

## 2018-09-17 DIAGNOSIS — N183 Chronic kidney disease, stage 3 (moderate): Secondary | ICD-10-CM | POA: Diagnosis not present

## 2018-09-17 DIAGNOSIS — I10 Essential (primary) hypertension: Secondary | ICD-10-CM | POA: Diagnosis not present

## 2018-09-17 DIAGNOSIS — R739 Hyperglycemia, unspecified: Secondary | ICD-10-CM | POA: Diagnosis not present

## 2018-09-17 DIAGNOSIS — E782 Mixed hyperlipidemia: Secondary | ICD-10-CM | POA: Diagnosis not present

## 2018-09-17 DIAGNOSIS — Z6827 Body mass index (BMI) 27.0-27.9, adult: Secondary | ICD-10-CM | POA: Diagnosis not present

## 2018-10-15 DIAGNOSIS — E7800 Pure hypercholesterolemia, unspecified: Secondary | ICD-10-CM | POA: Diagnosis not present

## 2018-10-15 DIAGNOSIS — J449 Chronic obstructive pulmonary disease, unspecified: Secondary | ICD-10-CM | POA: Diagnosis not present

## 2018-10-15 DIAGNOSIS — E039 Hypothyroidism, unspecified: Secondary | ICD-10-CM | POA: Diagnosis not present

## 2018-12-16 DIAGNOSIS — E782 Mixed hyperlipidemia: Secondary | ICD-10-CM | POA: Diagnosis not present

## 2018-12-16 DIAGNOSIS — E7800 Pure hypercholesterolemia, unspecified: Secondary | ICD-10-CM | POA: Diagnosis not present

## 2018-12-16 DIAGNOSIS — I10 Essential (primary) hypertension: Secondary | ICD-10-CM | POA: Diagnosis not present

## 2019-01-24 DIAGNOSIS — Z23 Encounter for immunization: Secondary | ICD-10-CM | POA: Diagnosis not present

## 2019-02-18 DIAGNOSIS — M5442 Lumbago with sciatica, left side: Secondary | ICD-10-CM | POA: Diagnosis not present

## 2019-02-18 DIAGNOSIS — M9903 Segmental and somatic dysfunction of lumbar region: Secondary | ICD-10-CM | POA: Diagnosis not present

## 2019-02-18 DIAGNOSIS — M47816 Spondylosis without myelopathy or radiculopathy, lumbar region: Secondary | ICD-10-CM | POA: Diagnosis not present

## 2019-02-19 DIAGNOSIS — M5442 Lumbago with sciatica, left side: Secondary | ICD-10-CM | POA: Diagnosis not present

## 2019-02-19 DIAGNOSIS — M47816 Spondylosis without myelopathy or radiculopathy, lumbar region: Secondary | ICD-10-CM | POA: Diagnosis not present

## 2019-02-19 DIAGNOSIS — M9903 Segmental and somatic dysfunction of lumbar region: Secondary | ICD-10-CM | POA: Diagnosis not present

## 2019-02-20 DIAGNOSIS — M47816 Spondylosis without myelopathy or radiculopathy, lumbar region: Secondary | ICD-10-CM | POA: Diagnosis not present

## 2019-02-20 DIAGNOSIS — M5442 Lumbago with sciatica, left side: Secondary | ICD-10-CM | POA: Diagnosis not present

## 2019-02-20 DIAGNOSIS — M9903 Segmental and somatic dysfunction of lumbar region: Secondary | ICD-10-CM | POA: Diagnosis not present

## 2019-02-21 DIAGNOSIS — M47816 Spondylosis without myelopathy or radiculopathy, lumbar region: Secondary | ICD-10-CM | POA: Diagnosis not present

## 2019-02-21 DIAGNOSIS — M5442 Lumbago with sciatica, left side: Secondary | ICD-10-CM | POA: Diagnosis not present

## 2019-02-21 DIAGNOSIS — M9903 Segmental and somatic dysfunction of lumbar region: Secondary | ICD-10-CM | POA: Diagnosis not present

## 2019-02-24 DIAGNOSIS — D1801 Hemangioma of skin and subcutaneous tissue: Secondary | ICD-10-CM | POA: Diagnosis not present

## 2019-02-24 DIAGNOSIS — L821 Other seborrheic keratosis: Secondary | ICD-10-CM | POA: Diagnosis not present

## 2019-02-24 DIAGNOSIS — L570 Actinic keratosis: Secondary | ICD-10-CM | POA: Diagnosis not present

## 2019-03-06 DIAGNOSIS — M47816 Spondylosis without myelopathy or radiculopathy, lumbar region: Secondary | ICD-10-CM | POA: Diagnosis not present

## 2019-03-06 DIAGNOSIS — M5442 Lumbago with sciatica, left side: Secondary | ICD-10-CM | POA: Diagnosis not present

## 2019-03-06 DIAGNOSIS — M9903 Segmental and somatic dysfunction of lumbar region: Secondary | ICD-10-CM | POA: Diagnosis not present

## 2019-03-07 ENCOUNTER — Other Ambulatory Visit: Payer: Self-pay

## 2019-03-19 DIAGNOSIS — E782 Mixed hyperlipidemia: Secondary | ICD-10-CM | POA: Diagnosis not present

## 2019-03-19 DIAGNOSIS — N185 Chronic kidney disease, stage 5: Secondary | ICD-10-CM | POA: Diagnosis not present

## 2019-03-19 DIAGNOSIS — I10 Essential (primary) hypertension: Secondary | ICD-10-CM | POA: Diagnosis not present

## 2019-03-19 DIAGNOSIS — R739 Hyperglycemia, unspecified: Secondary | ICD-10-CM | POA: Diagnosis not present

## 2019-03-19 DIAGNOSIS — E039 Hypothyroidism, unspecified: Secondary | ICD-10-CM | POA: Diagnosis not present

## 2019-03-19 DIAGNOSIS — E7800 Pure hypercholesterolemia, unspecified: Secondary | ICD-10-CM | POA: Diagnosis not present

## 2019-03-27 DIAGNOSIS — I129 Hypertensive chronic kidney disease with stage 1 through stage 4 chronic kidney disease, or unspecified chronic kidney disease: Secondary | ICD-10-CM | POA: Diagnosis not present

## 2019-03-27 DIAGNOSIS — D631 Anemia in chronic kidney disease: Secondary | ICD-10-CM | POA: Diagnosis not present

## 2019-03-27 DIAGNOSIS — I701 Atherosclerosis of renal artery: Secondary | ICD-10-CM | POA: Diagnosis not present

## 2019-03-27 DIAGNOSIS — N189 Chronic kidney disease, unspecified: Secondary | ICD-10-CM | POA: Diagnosis not present

## 2019-03-27 DIAGNOSIS — N183 Chronic kidney disease, stage 3 unspecified: Secondary | ICD-10-CM | POA: Diagnosis not present

## 2019-03-27 DIAGNOSIS — N2581 Secondary hyperparathyroidism of renal origin: Secondary | ICD-10-CM | POA: Diagnosis not present

## 2019-04-02 DIAGNOSIS — E782 Mixed hyperlipidemia: Secondary | ICD-10-CM | POA: Diagnosis not present

## 2019-04-02 DIAGNOSIS — R739 Hyperglycemia, unspecified: Secondary | ICD-10-CM | POA: Diagnosis not present

## 2019-04-02 DIAGNOSIS — Z6828 Body mass index (BMI) 28.0-28.9, adult: Secondary | ICD-10-CM | POA: Diagnosis not present

## 2019-04-02 DIAGNOSIS — Z0000 Encounter for general adult medical examination without abnormal findings: Secondary | ICD-10-CM | POA: Diagnosis not present

## 2019-04-02 DIAGNOSIS — N183 Chronic kidney disease, stage 3 unspecified: Secondary | ICD-10-CM | POA: Diagnosis not present

## 2019-04-02 DIAGNOSIS — I10 Essential (primary) hypertension: Secondary | ICD-10-CM | POA: Diagnosis not present

## 2019-05-16 DIAGNOSIS — I10 Essential (primary) hypertension: Secondary | ICD-10-CM | POA: Diagnosis not present

## 2019-05-16 DIAGNOSIS — N189 Chronic kidney disease, unspecified: Secondary | ICD-10-CM | POA: Diagnosis not present

## 2019-06-13 DIAGNOSIS — I10 Essential (primary) hypertension: Secondary | ICD-10-CM | POA: Diagnosis not present

## 2019-06-13 DIAGNOSIS — E039 Hypothyroidism, unspecified: Secondary | ICD-10-CM | POA: Diagnosis not present

## 2019-09-15 DIAGNOSIS — I129 Hypertensive chronic kidney disease with stage 1 through stage 4 chronic kidney disease, or unspecified chronic kidney disease: Secondary | ICD-10-CM | POA: Diagnosis not present

## 2019-09-15 DIAGNOSIS — E7849 Other hyperlipidemia: Secondary | ICD-10-CM | POA: Diagnosis not present

## 2019-09-15 DIAGNOSIS — N183 Chronic kidney disease, stage 3 unspecified: Secondary | ICD-10-CM | POA: Diagnosis not present

## 2019-09-29 DIAGNOSIS — E039 Hypothyroidism, unspecified: Secondary | ICD-10-CM | POA: Diagnosis not present

## 2019-09-29 DIAGNOSIS — E7800 Pure hypercholesterolemia, unspecified: Secondary | ICD-10-CM | POA: Diagnosis not present

## 2019-09-29 DIAGNOSIS — I10 Essential (primary) hypertension: Secondary | ICD-10-CM | POA: Diagnosis not present

## 2019-09-29 DIAGNOSIS — Z6827 Body mass index (BMI) 27.0-27.9, adult: Secondary | ICD-10-CM | POA: Diagnosis not present

## 2019-09-29 DIAGNOSIS — E782 Mixed hyperlipidemia: Secondary | ICD-10-CM | POA: Diagnosis not present

## 2019-09-29 DIAGNOSIS — J449 Chronic obstructive pulmonary disease, unspecified: Secondary | ICD-10-CM | POA: Diagnosis not present

## 2019-09-29 DIAGNOSIS — I6529 Occlusion and stenosis of unspecified carotid artery: Secondary | ICD-10-CM | POA: Diagnosis not present

## 2019-11-17 DIAGNOSIS — I6523 Occlusion and stenosis of bilateral carotid arteries: Secondary | ICD-10-CM | POA: Diagnosis not present

## 2019-11-25 ENCOUNTER — Other Ambulatory Visit: Payer: Self-pay | Admitting: *Deleted

## 2019-11-25 DIAGNOSIS — I6529 Occlusion and stenosis of unspecified carotid artery: Secondary | ICD-10-CM

## 2019-11-26 ENCOUNTER — Ambulatory Visit (HOSPITAL_COMMUNITY)
Admission: RE | Admit: 2019-11-26 | Discharge: 2019-11-26 | Disposition: A | Discharge status: Home / Self Care | Payer: PPO | Source: Ambulatory Visit | Attending: Vascular Surgery | Admitting: Vascular Surgery

## 2019-11-26 ENCOUNTER — Other Ambulatory Visit: Payer: Self-pay

## 2019-11-26 DIAGNOSIS — I6529 Occlusion and stenosis of unspecified carotid artery: Secondary | ICD-10-CM | POA: Insufficient documentation

## 2019-12-02 ENCOUNTER — Encounter: Payer: Self-pay | Admitting: *Deleted

## 2019-12-02 ENCOUNTER — Other Ambulatory Visit: Payer: Self-pay | Admitting: *Deleted

## 2019-12-02 ENCOUNTER — Encounter: Payer: Self-pay | Admitting: Vascular Surgery

## 2019-12-02 ENCOUNTER — Other Ambulatory Visit: Payer: Self-pay

## 2019-12-02 ENCOUNTER — Encounter: Payer: PPO | Admitting: Vascular Surgery

## 2019-12-02 ENCOUNTER — Ambulatory Visit: Payer: PPO | Admitting: Vascular Surgery

## 2019-12-02 VITALS — BP 132/74 | HR 64 | Temp 98.3°F | Resp 20 | Ht 72.0 in | Wt 203.0 lb

## 2019-12-02 DIAGNOSIS — I6529 Occlusion and stenosis of unspecified carotid artery: Secondary | ICD-10-CM | POA: Diagnosis not present

## 2019-12-02 NOTE — H&P (View-Only) (Signed)
Vascular and Vein Specialist of 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 He reports that he had a Panorex x-ray for a dental procedure. Was found to have severe calcification and the dentist recommended that he have further follow-up with his medical doctor. He underwent carotid duplex showing severe disease. He is here today for continued discussion of this. He denies any prior neurologic deficits. He has no history of amaurosis fugax, aphasia or transient ischemic attack or stroke. He has no history of cardiac disease. Does have renal insufficiency and has undergone prior renal angioplasty and stenting in 2018.  Past Medical History:  Diagnosis Date   Chronic kidney disease    COPD (chronic obstructive pulmonary disease) (HCC)    Hypertension    Thyroid disease    Vertigo     History reviewed. No pertinent family history.  SOCIAL HISTORY: Social History   Socioeconomic History   Marital status: Married    Spouse name: Not on file   Number of children: 2   Years of education: Not on file   Highest education level: Not on file  Occupational History   Not on file  Tobacco Use   Smoking status: Former Smoker    Packs/day: 1.00    Years: 30.00    Pack years: 30.00    Types: Cigarettes    Quit date: 06/19/2008    Years since quitting: 11.4   Smokeless tobacco: Never Used  Vaping Use   Vaping Use: Never used  Substance and Sexual Activity   Alcohol use: Yes    Alcohol/week: 7.0 - 10.0 standard drinks    Types: 7 - 10 Cans of beer per week    Comment: daily    Drug use: No   Sexual activity: Yes    Partners: Female  Other Topics Concern   Not on file  Social History Narrative   Not on file   Social  Determinants of Health   Financial Resource Strain:    Difficulty of Paying Living Expenses:   Food Insecurity:    Worried About Charity fundraiser in the Last Year:    Arboriculturist in the Last Year:   Transportation Needs:    Film/video editor (Medical):    Lack of Transportation (Non-Medical):   Physical Activity:    Days of Exercise per Week:    Minutes of Exercise per Session:   Stress:    Feeling of Stress :   Social Connections:    Frequency of Communication with Friends and Family:    Frequency of Social Gatherings with Friends and Family:    Attends Religious Services:    Active Member of Clubs or Organizations:    Attends Archivist Meetings:    Marital Status:   Intimate Partner Violence:    Fear of Current or Ex-Partner:    Emotionally Abused:    Physically Abused:    Sexually Abused:     Allergies  Allergen Reactions   Sulfa Antibiotics Hives    Current Outpatient Medications  Medication Sig Dispense Refill   albuterol (PROVENTIL HFA;VENTOLIN HFA) 108 (90 Base) MCG/ACT inhaler Inhale 2 puffs into the lungs every 4 (four) hours as needed for wheezing or shortness of breath.     aspirin 81 MG tablet Take  81 mg by mouth daily.     atorvastatin (LIPITOR) 10 MG tablet Take 10 mg by mouth daily.     cholecalciferol (VITAMIN D) 1000 units tablet Take 1,000 Units by mouth daily.     co-enzyme Q-10 30 MG capsule Take 30 mg by mouth 3 (three) times daily.     gemfibrozil (LOPID) 600 MG tablet Take 600 mg by mouth daily.     levothyroxine (SYNTHROID) 25 MCG tablet Take 25 mcg by mouth daily.     Omega-3 Fatty Acids (FISH OIL) 1000 MG CPDR Take 1 capsule by mouth daily.     tadalafil (CIALIS) 20 MG tablet Take 20 mg by mouth every morning.     tamsulosin (FLOMAX) 0.4 MG CAPS capsule SMARTSIG:1 Capsule(s) By Mouth Every Evening     Tiotropium Bromide Monohydrate (SPIRIVA RESPIMAT) 2.5 MCG/ACT AERS Inhale 2 puffs into the  lungs daily.     No current facility-administered medications for this visit.    REVIEW OF SYSTEMS:  [X]  denotes positive finding, [ ]  denotes negative finding Cardiac  Comments:  Chest pain or chest pressure:    Shortness of breath upon exertion:    Short of breath when lying flat:    Irregular heart rhythm:        Vascular    Pain in calf, thigh, or hip brought on by ambulation:    Pain in feet at night that wakes you up from your sleep:     Blood clot in your veins:    Leg swelling:         Pulmonary    Oxygen at home:    Productive cough:     Wheezing:         Neurologic    Sudden weakness in arms or legs:     Sudden numbness in arms or legs:     Sudden onset of difficulty speaking or slurred speech:    Temporary loss of vision in one eye:     Problems with dizziness:         Gastrointestinal    Blood in stool:     Vomited blood:         Genitourinary    Burning when urinating:     Blood in urine:        Psychiatric    Major depression:         Hematologic    Bleeding problems:    Problems with blood clotting too easily:        Skin    Rashes or ulcers:        Constitutional    Fever or chills:      PHYSICAL EXAM: Vitals:   12/02/19 0831 12/02/19 0832  BP: (!) 145/74 132/74  Pulse: 64   Resp: 20   Temp: 98.3 F (36.8 C)   SpO2: 95%   Weight: 203 lb (92.1 kg)   Height: 6' (1.829 m)     GENERAL: The patient is a well-nourished male, in no acute distress. The vital signs are documented above. CARDIOVASCULAR: I do not hear carotid bruits bilaterally. He has 2+ radial and 2+ dorsalis pedis pulses bilaterally PULMONARY: There is good air exchange  ABDOMEN: Soft and non-tender  MUSCULOSKELETAL: There are no major deformities or cyanosis. NEUROLOGIC: No focal weakness or paresthesias are detected. SKIN: There are no ulcers or rashes noted. PSYCHIATRIC: The patient has a normal affect.  DATA:  Carotid duplex evaluation was discussed with the  patient from 11/26/2019. 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  MEDICAL ISSUES: Had long discussion with the patient regarding these findings. Explained that he has obviously good collaterals through his circle of Willis. Explained there is no treatment option for his left internal carotid occlusion. I have recommended carotid endarterectomy for treatment of his severe right asymptomatic disease. He is right-handed. We also discussed the relatively new technique ofTCAR. I have recommended standard carotid endarterectomy due to its known long-term durability. He understands and will proceed as soon as possible. Understands this is a expected 1 night hospitalization. Also discussed the 1 to 1-1/2% risk of stroke with surgery.   Rosetta Posner, MD FACS Vascular and Vein Specialists of [facility] Tel [phone] Pager [phone]

## 2019-12-02 NOTE — Progress Notes (Signed)
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 He reports that he had a Panorex x-ray for a dental procedure. Was found to have severe calcification and the dentist recommended that he have further follow-up with his medical doctor. He underwent carotid duplex showing severe disease. He is here today for continued discussion of this. He denies any prior neurologic deficits. He has no history of amaurosis fugax, aphasia or transient ischemic attack or stroke. He has no history of cardiac disease. Does have renal insufficiency and has undergone prior renal angioplasty and stenting in 2018.  Past Medical History:  Diagnosis Date  . Chronic kidney disease   . COPD (chronic obstructive pulmonary disease) ([place])   . Hypertension   . Thyroid disease   . Vertigo     History reviewed. No pertinent family history.  SOCIAL HISTORY: Social History   Socioeconomic History  . Marital status: Married    Spouse name: Not on file  . Number of children: 2  . Years of education: Not on file  . Highest education level: Not on file  Occupational History  . Not on file  Tobacco Use  . Smoking status: Former Smoker    Packs/day: 1.00    Years: 30.00    Pack years: 30.00    Types: Cigarettes    Quit date: 06/19/2008    Years since quitting: 11.4  . Smokeless tobacco: Never Used  Vaping Use  . Vaping Use: Never used  Substance and Sexual Activity  . Alcohol use: Yes    Alcohol/week: 7.0 - 10.0 standard drinks    Types: 7 - 10 Cans of beer per week    Comment: daily   . Drug use: No  . Sexual activity: Yes    Partners: Female  Other Topics Concern  . Not on file  Social History Narrative  . Not on file   Social  Determinants of Health   Financial Resource Strain:   . Difficulty of Paying Living Expenses:   Food Insecurity:   . Worried About Charity fundraiser in the Last Year:   . Arboriculturist in the Last Year:   Transportation Needs:   . Film/video editor (Medical):   Marland Kitchen Lack of Transportation (Non-Medical):   Physical Activity:   . Days of Exercise per Week:   . Minutes of Exercise per Session:   Stress:   . Feeling of Stress :   Social Connections:   . Frequency of Communication with Friends and Family:   . Frequency of Social Gatherings with Friends and Family:   . Attends Religious Services:   . Active Member of Clubs or Organizations:   . Attends Archivist Meetings:   Marland Kitchen Marital Status:   Intimate Partner Violence:   . Fear of Current or Ex-Partner:   . Emotionally Abused:   Marland Kitchen Physically Abused:   . Sexually Abused:     Allergies  Allergen Reactions  . Sulfa Antibiotics Hives    Current Outpatient Medications  Medication Sig Dispense Refill  . albuterol (PROVENTIL HFA;VENTOLIN HFA) 108 (90 Base) MCG/ACT inhaler Inhale 2 puffs into the lungs every 4 (four) hours as needed for wheezing or shortness of breath.    Marland Kitchen aspirin 81 MG tablet Take  81 mg by mouth daily.    Marland Kitchen atorvastatin (LIPITOR) 10 MG tablet Take 10 mg by mouth daily.    . cholecalciferol (VITAMIN D) 1000 units tablet Take 1,000 Units by mouth daily.    Marland Kitchen co-enzyme Q-10 30 MG capsule Take 30 mg by mouth 3 (three) times daily.    Marland Kitchen gemfibrozil (LOPID) 600 MG tablet Take 600 mg by mouth daily.    Marland Kitchen levothyroxine (SYNTHROID) 25 MCG tablet Take 25 mcg by mouth daily.    . Omega-3 Fatty Acids (FISH OIL) 1000 MG CPDR Take 1 capsule by mouth daily.    . tadalafil (CIALIS) 20 MG tablet Take 20 mg by mouth every morning.    . tamsulosin (FLOMAX) 0.4 MG CAPS capsule SMARTSIG:1 Capsule(s) By Mouth Every Evening    . Tiotropium Bromide Monohydrate (SPIRIVA RESPIMAT) 2.5 MCG/ACT AERS Inhale 2 puffs into the  lungs daily.     No current facility-administered medications for this visit.    REVIEW OF SYSTEMS:  [X]  denotes positive finding, [ ]  denotes negative finding Cardiac  Comments:  Chest pain or chest pressure:    Shortness of breath upon exertion:    Short of breath when lying flat:    Irregular heart rhythm:        Vascular    Pain in calf, thigh, or hip brought on by ambulation:    Pain in feet at night that wakes you up from your sleep:     Blood clot in your veins:    Leg swelling:         Pulmonary    Oxygen at home:    Productive cough:     Wheezing:         Neurologic    Sudden weakness in arms or legs:     Sudden numbness in arms or legs:     Sudden onset of difficulty speaking or slurred speech:    Temporary loss of vision in one eye:     Problems with dizziness:         Gastrointestinal    Blood in stool:     Vomited blood:         Genitourinary    Burning when urinating:     Blood in urine:        Psychiatric    Major depression:         Hematologic    Bleeding problems:    Problems with blood clotting too easily:        Skin    Rashes or ulcers:        Constitutional    Fever or chills:      PHYSICAL EXAM: Vitals:   12/02/19 0831 12/02/19 0832  BP: (!) 145/74 132/74  Pulse: 64   Resp: 20   Temp: 98.3 F (36.8 C)   SpO2: 95%   Weight: 203 lb (92.1 kg)   Height: 6' (1.829 m)     GENERAL: The patient is a well-nourished male, in no acute distress. The vital signs are documented above. CARDIOVASCULAR: I do not hear carotid bruits bilaterally. He has 2+ radial and 2+ dorsalis pedis pulses bilaterally PULMONARY: There is good air exchange  ABDOMEN: Soft and non-tender  MUSCULOSKELETAL: There are no major deformities or cyanosis. NEUROLOGIC: No focal weakness or paresthesias are detected. SKIN: There are no ulcers or rashes noted. PSYCHIATRIC: The patient has a normal affect.  DATA:  Carotid duplex evaluation was discussed with the  patient from 11/26/2019. 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  MEDICAL ISSUES: Had long discussion with the patient regarding these findings. Explained that he has obviously good collaterals through his circle of Willis. Explained there is no treatment option for his left internal carotid occlusion. I have recommended carotid endarterectomy for treatment of his severe right asymptomatic disease. He is right-handed. We also discussed the relatively new technique ofTCAR. I have recommended standard carotid endarterectomy due to its known long-term durability. He understands and will proceed as soon as possible. Understands this is a expected 1 night hospitalization. Also discussed the 1 to 1-1/2% risk of stroke with surgery.   Rosetta Posner, MD FACS Vascular and Vein Specialists of [facility] Tel [phone] Pager [phone]

## 2019-12-08 ENCOUNTER — Encounter (HOSPITAL_COMMUNITY): Payer: Self-pay

## 2019-12-08 ENCOUNTER — Other Ambulatory Visit: Payer: Self-pay

## 2019-12-08 ENCOUNTER — Encounter (HOSPITAL_COMMUNITY)
Admission: RE | Admit: 2019-12-08 | Discharge: 2019-12-08 | Disposition: A | Discharge status: Home / Self Care | Payer: PPO | Source: Ambulatory Visit | Attending: Vascular Surgery | Admitting: Vascular Surgery

## 2019-12-08 DIAGNOSIS — Z01812 Encounter for preprocedural laboratory examination: Secondary | ICD-10-CM | POA: Diagnosis present

## 2019-12-08 DIAGNOSIS — I129 Hypertensive chronic kidney disease with stage 1 through stage 4 chronic kidney disease, or unspecified chronic kidney disease: Secondary | ICD-10-CM | POA: Diagnosis not present

## 2019-12-08 DIAGNOSIS — Z793 Long term (current) use of hormonal contraceptives: Secondary | ICD-10-CM | POA: Diagnosis not present

## 2019-12-08 DIAGNOSIS — Z20822 Contact with and (suspected) exposure to covid-19: Secondary | ICD-10-CM | POA: Diagnosis not present

## 2019-12-08 DIAGNOSIS — Z7951 Long term (current) use of inhaled steroids: Secondary | ICD-10-CM | POA: Diagnosis not present

## 2019-12-08 DIAGNOSIS — E039 Hypothyroidism, unspecified: Secondary | ICD-10-CM | POA: Diagnosis present

## 2019-12-08 DIAGNOSIS — Z01818 Encounter for other preprocedural examination: Secondary | ICD-10-CM | POA: Insufficient documentation

## 2019-12-08 DIAGNOSIS — J449 Chronic obstructive pulmonary disease, unspecified: Secondary | ICD-10-CM | POA: Diagnosis present

## 2019-12-08 DIAGNOSIS — Z882 Allergy status to sulfonamides status: Secondary | ICD-10-CM | POA: Diagnosis not present

## 2019-12-08 DIAGNOSIS — Z8679 Personal history of other diseases of the circulatory system: Secondary | ICD-10-CM | POA: Diagnosis not present

## 2019-12-08 DIAGNOSIS — I959 Hypotension, unspecified: Secondary | ICD-10-CM | POA: Diagnosis not present

## 2019-12-08 DIAGNOSIS — N189 Chronic kidney disease, unspecified: Secondary | ICD-10-CM | POA: Diagnosis present

## 2019-12-08 DIAGNOSIS — Z79899 Other long term (current) drug therapy: Secondary | ICD-10-CM | POA: Diagnosis not present

## 2019-12-08 DIAGNOSIS — R001 Bradycardia, unspecified: Secondary | ICD-10-CM | POA: Diagnosis not present

## 2019-12-08 DIAGNOSIS — I6521 Occlusion and stenosis of right carotid artery: Secondary | ICD-10-CM | POA: Diagnosis present

## 2019-12-08 DIAGNOSIS — Z7982 Long term (current) use of aspirin: Secondary | ICD-10-CM | POA: Diagnosis not present

## 2019-12-08 DIAGNOSIS — Z87891 Personal history of nicotine dependence: Secondary | ICD-10-CM | POA: Diagnosis not present

## 2019-12-08 HISTORY — DX: Dyspnea, unspecified: R06.00

## 2019-12-08 HISTORY — DX: Unspecified osteoarthritis, unspecified site: M19.90

## 2019-12-08 HISTORY — DX: Hypothyroidism, unspecified: E03.9

## 2019-12-08 LAB — CBC
HCT: 41.8 % (ref 39.0–52.0)
Hemoglobin: 14.2 g/dL (ref 13.0–17.0)
MCH: 30.4 pg (ref 26.0–34.0)
MCHC: 34 g/dL (ref 30.0–36.0)
MCV: 89.5 fL (ref 80.0–100.0)
Platelets: 142 10*3/uL — ABNORMAL LOW (ref 150–400)
RBC: 4.67 MIL/uL (ref 4.22–5.81)
RDW: 13.1 % (ref 11.5–15.5)
WBC: 6.5 10*3/uL (ref 4.0–10.5)
nRBC: 0 % (ref 0.0–0.2)

## 2019-12-08 LAB — COMPREHENSIVE METABOLIC PANEL
ALT: 21 U/L (ref 0–44)
AST: 21 U/L (ref 15–41)
Albumin: 4.2 g/dL (ref 3.5–5.0)
Alkaline Phosphatase: 61 U/L (ref 38–126)
Anion gap: 12 (ref 5–15)
BUN: 20 mg/dL (ref 8–23)
CO2: 21 mmol/L — ABNORMAL LOW (ref 22–32)
Calcium: 9.3 mg/dL (ref 8.9–10.3)
Chloride: 103 mmol/L (ref 98–111)
Creatinine, Ser: 1.79 mg/dL — ABNORMAL HIGH (ref 0.61–1.24)
GFR calc Af Amer: 43 mL/min — ABNORMAL LOW (ref >60)
GFR calc non Af Amer: 37 mL/min — ABNORMAL LOW (ref >60)
Glucose, Bld: 209 mg/dL — ABNORMAL HIGH (ref 70–99)
Potassium: 3.7 mmol/L (ref 3.5–5.1)
Sodium: 136 mmol/L (ref 135–145)
Total Bilirubin: 0.9 mg/dL (ref 0.3–1.2)
Total Protein: 7.2 g/dL (ref 6.5–8.1)

## 2019-12-08 LAB — URINALYSIS, ROUTINE W REFLEX MICROSCOPIC
Bilirubin Urine: NEGATIVE
Glucose, UA: NEGATIVE mg/dL
Hgb urine dipstick: NEGATIVE
Ketones, ur: NEGATIVE mg/dL
Leukocytes,Ua: NEGATIVE
Nitrite: NEGATIVE
Protein, ur: NEGATIVE mg/dL
Specific Gravity, Urine: 1.012 (ref 1.005–1.030)
pH: 5 (ref 5.0–8.0)

## 2019-12-08 LAB — SURGICAL PCR SCREEN
MRSA, PCR: NEGATIVE
Staphylococcus aureus: NEGATIVE

## 2019-12-08 LAB — PROTIME-INR
INR: 1 (ref 0.8–1.2)
Prothrombin Time: 13.2 seconds (ref 11.4–15.2)

## 2019-12-08 LAB — APTT: aPTT: 28 seconds (ref 24–36)

## 2019-12-08 NOTE — Progress Notes (Addendum)
PCP - Rory Percy [address] [place] in [facility] Cardiologist - na   Chest x-ray - na EKG - 12/08/19 Stress Test -  2019 2 [facility]-doesn't remember why       Addendum by Karoline Caldwell, PA-C 12/09/19 - Records received from [facility] only include carotid ultrasound, no stress test ECHO - na Cardiac Cath - na  Sleep Study - na   Blood Thinner Instructions: na Aspirin Instructions: continue    COVID TEST- 12/10/19 @ [facility]   Anesthesia review:   Patient denies shortness of breath, fever, cough and chest pain at PAT appointment   All instructions explained to the patient, with a verbal understanding of the material. Patient agrees to go over the instructions while at home for a better understanding. Patient also instructed to self quarantine after being tested for COVID-19. The opportunity to ask questions was provided.

## 2019-12-08 NOTE — Pre-Procedure Instructions (Signed)
Shelden Bushway  12/08/2019      [id number] [place] [place] [address] [place] [address] Phone: [phone] Fax: [phone]  [facility] [place], [place] [address] [place] [address] Phone: [phone] Fax: [phone]    Your procedure is scheduled on Aug. 26  Report to [facility] Entrance A at 5:30 A.M.  Call this number if you have problems the morning of surgery:  [phone]   Remember:  Do not eat or drink after midnight.    Medicines to take  the morning of surgery with A SIP OF WATER :               Albuterol inhaler if needed--bring to hospital              Atorvastatin (lipitor)              Gemfibrozil (lopid)              Levothyroxine (synthroid)              tamsulosin (flomax)              spiriva--bring to hospital              7 days prior to surgery STOP taking any Aspirin (unless otherwise instructed by your surgeon), Aleve, Naproxen, Ibuprofen, Motrin, Advil, Goody's, BC's, all herbal medications, fish oil, and all vitamins.                Follow your surgeon's instructions on when to stop Aspirin.  If no instructions were given by your surgeon then you will need to call the office to get those instructions.                    Do not wear jewelry.  Do not wear lotions, powders, or perfumes, or deodorant.  Do not shave 48 hours prior to surgery.  Men may shave face and neck.  Do not bring valuables to the hospital.  [facility] is not responsible for any belongings or valuables.  Contacts, dentures or bridgework may not be worn into surgery.  Leave your suitcase in the car.  After surgery it may be brought to your room.  For patients admitted to the hospital, discharge time will be determined by your treatment team.  Patients discharged the day of surgery will not be allowed to drive home.    Special instructions:   [facility]- Preparing For Surgery  Before surgery, you can play an important role. Because skin is not sterile, your skin needs to be as free of germs as possible. You can reduce the number of germs on your skin by washing with CHG (chlorahexidine gluconate) Soap before surgery.  CHG is an antiseptic cleaner which kills germs and bonds with the skin to continue killing germs even after washing.    Oral Hygiene is also important to reduce your risk of infection.  Remember - BRUSH YOUR TEETH THE MORNING OF SURGERY WITH YOUR REGULAR TOOTHPASTE  Please do not use if you have an allergy to CHG or antibacterial soaps. If your skin becomes reddened/irritated stop using the CHG.  Do not shave (including legs and underarms) for at least 48 hours prior to first CHG shower. It is OK to shave your face.  Please follow these instructions carefully.  1. Shower the NIGHT BEFORE SURGERY and the MORNING OF SURGERY with CHG.   2. If you chose to wash your hair, wash your hair first as usual with your normal shampoo.  3. After you shampoo, rinse your hair and body thoroughly to remove the shampoo.  4. Use CHG as you would any other liquid soap. You can apply CHG directly to the skin and wash gently with a scrungie or a clean washcloth.   5. Apply the CHG Soap to your body ONLY FROM THE NECK DOWN.  Do not use on open wounds or open sores. Avoid contact with your eyes, ears, mouth and genitals (private parts). Wash Face and genitals (private parts)  with your normal soap.  6. Wash thoroughly, paying special attention to the area where your surgery will be performed.  7. Thoroughly rinse your body with warm water from the neck down.  8. DO NOT shower/wash with your normal soap after using and rinsing off the CHG Soap.  9. Pat yourself dry with a CLEAN TOWEL.  10. Wear CLEAN PAJAMAS to bed the night before surgery, wear comfortable clothes the morning of surgery  11. Place CLEAN SHEETS on your bed the night of  your first shower and DO NOT SLEEP WITH PETS.    Day of Surgery:  Do not apply any deodorants/lotions.  Please wear clean clothes to the hospital/surgery center.   Remember to brush your teeth WITH YOUR REGULAR TOOTHPASTE.    Please read over the following fact sheets that you were given.

## 2019-12-10 ENCOUNTER — Other Ambulatory Visit: Payer: Self-pay

## 2019-12-10 ENCOUNTER — Other Ambulatory Visit
Admission: RE | Admit: 2019-12-10 | Discharge: 2019-12-10 | Disposition: A | Discharge status: Home / Self Care | Payer: PPO | Source: Ambulatory Visit | Attending: Vascular Surgery | Admitting: Vascular Surgery

## 2019-12-10 DIAGNOSIS — Z01812 Encounter for preprocedural laboratory examination: Secondary | ICD-10-CM | POA: Insufficient documentation

## 2019-12-10 DIAGNOSIS — Z20822 Contact with and (suspected) exposure to covid-19: Secondary | ICD-10-CM | POA: Insufficient documentation

## 2019-12-10 LAB — SARS CORONAVIRUS 2 (TAT 6-24 HRS): SARS Coronavirus 2: NEGATIVE

## 2019-12-10 NOTE — Anesthesia Preprocedure Evaluation (Addendum)
Anesthesia Evaluation  Patient identified by MRN, date of birth, ID band Patient awake    Reviewed: Allergy & Precautions, NPO status , Patient's Chart, lab work & pertinent test results  Airway Mallampati: II  TM Distance: <3 FB Neck ROM: Full    Dental  (+) Dental Advisory Given   Pulmonary COPD,  COPD inhaler, former smoker,    breath sounds clear to auscultation       Cardiovascular hypertension, Pt. on medications  Rhythm:Regular Rate:Normal     Neuro/Psych negative neurological ROS     GI/Hepatic Neg liver ROS,   Endo/Other  Hypothyroidism   Renal/GU CRFRenal disease     Musculoskeletal  (+) Arthritis ,   Abdominal   Peds  Hematology negative hematology ROS (+)   Anesthesia Other Findings   Reproductive/Obstetrics                            Lab Results  Component Value Date   WBC 6.5 12/08/2019   HGB 14.2 12/08/2019   HCT 41.8 12/08/2019   MCV 89.5 12/08/2019   PLT 142 (L) 12/08/2019   Lab Results  Component Value Date   CREATININE 1.79 (H) 12/08/2019   BUN 20 12/08/2019   NA 136 12/08/2019   K 3.7 12/08/2019   CL 103 12/08/2019   CO2 21 (L) 12/08/2019    Anesthesia Physical Anesthesia Plan  ASA: III  Anesthesia Plan: General   Post-op Pain Management:    Induction: Intravenous  PONV Risk Score and Plan: 2 and Dexamethasone, Ondansetron and Treatment may vary due to age or medical condition  Airway Management Planned: Oral ETT  Additional Equipment: Arterial line  Intra-op Plan:   Post-operative Plan: Extubation in OR  Informed Consent: I have reviewed the patients History and Physical, chart, labs and discussed the procedure including the risks, benefits and alternatives for the proposed anesthesia with the patient or authorized representative who has indicated his/her understanding and acceptance.     Dental advisory given  Plan Discussed with:  CRNA  Anesthesia Plan Comments:        Anesthesia Quick Evaluation

## 2019-12-11 ENCOUNTER — Inpatient Hospital Stay (HOSPITAL_COMMUNITY): Payer: PPO | Admitting: Physician Assistant

## 2019-12-11 ENCOUNTER — Inpatient Hospital Stay (HOSPITAL_COMMUNITY): Payer: PPO | Admitting: Certified Registered Nurse Anesthetist

## 2019-12-11 ENCOUNTER — Other Ambulatory Visit: Payer: Self-pay

## 2019-12-11 ENCOUNTER — Encounter (HOSPITAL_COMMUNITY): Payer: Self-pay | Admitting: Vascular Surgery

## 2019-12-11 ENCOUNTER — Inpatient Hospital Stay (HOSPITAL_COMMUNITY)
Admission: RE | Admit: 2019-12-11 | Discharge: 2019-12-12 | DRG: 039 | Disposition: A | Discharge status: Home / Self Care | Payer: PPO | Attending: Vascular Surgery | Admitting: Vascular Surgery

## 2019-12-11 ENCOUNTER — Encounter (HOSPITAL_COMMUNITY)
Admission: RE | Disposition: A | Discharge status: Home / Self Care | Payer: Self-pay | Source: Home / Self Care | Attending: Vascular Surgery

## 2019-12-11 DIAGNOSIS — Z7951 Long term (current) use of inhaled steroids: Secondary | ICD-10-CM

## 2019-12-11 DIAGNOSIS — R001 Bradycardia, unspecified: Secondary | ICD-10-CM | POA: Diagnosis not present

## 2019-12-11 DIAGNOSIS — Z793 Long term (current) use of hormonal contraceptives: Secondary | ICD-10-CM | POA: Diagnosis not present

## 2019-12-11 DIAGNOSIS — I6521 Occlusion and stenosis of right carotid artery: Principal | ICD-10-CM | POA: Diagnosis present

## 2019-12-11 DIAGNOSIS — Z87891 Personal history of nicotine dependence: Secondary | ICD-10-CM | POA: Diagnosis not present

## 2019-12-11 DIAGNOSIS — Z7982 Long term (current) use of aspirin: Secondary | ICD-10-CM

## 2019-12-11 DIAGNOSIS — Z79899 Other long term (current) drug therapy: Secondary | ICD-10-CM | POA: Diagnosis not present

## 2019-12-11 DIAGNOSIS — Z882 Allergy status to sulfonamides status: Secondary | ICD-10-CM

## 2019-12-11 DIAGNOSIS — Z8679 Personal history of other diseases of the circulatory system: Secondary | ICD-10-CM | POA: Diagnosis not present

## 2019-12-11 DIAGNOSIS — I6529 Occlusion and stenosis of unspecified carotid artery: Secondary | ICD-10-CM | POA: Diagnosis present

## 2019-12-11 DIAGNOSIS — E039 Hypothyroidism, unspecified: Secondary | ICD-10-CM | POA: Diagnosis present

## 2019-12-11 DIAGNOSIS — N189 Chronic kidney disease, unspecified: Secondary | ICD-10-CM | POA: Diagnosis present

## 2019-12-11 DIAGNOSIS — I959 Hypotension, unspecified: Secondary | ICD-10-CM | POA: Diagnosis not present

## 2019-12-11 DIAGNOSIS — J449 Chronic obstructive pulmonary disease, unspecified: Secondary | ICD-10-CM | POA: Diagnosis present

## 2019-12-11 HISTORY — PX: ENDARTERECTOMY: SHX5162

## 2019-12-11 HISTORY — PX: PATCH ANGIOPLASTY: SHX6230

## 2019-12-11 SURGERY — ENDARTERECTOMY, CAROTID
Anesthesia: General | Site: Neck | Laterality: Right

## 2019-12-11 MED ORDER — ACETAMINOPHEN 500 MG PO TABS: 1000.0000 mg | ORAL_TABLET | Freq: Once | ORAL | Status: AC

## 2019-12-11 MED ORDER — ACETAMINOPHEN 500 MG PO TABS
ORAL_TABLET | ORAL | Status: AC
  Administered 2019-12-11: 1000 mg via ORAL
  Filled 2019-12-11: qty 2

## 2019-12-11 MED ORDER — DEXAMETHASONE SODIUM PHOSPHATE 10 MG/ML IJ SOLN
INTRAMUSCULAR | Status: DC | PRN
  Administered 2019-12-11: 10 mg via INTRAVENOUS

## 2019-12-11 MED ORDER — LABETALOL HCL 5 MG/ML IV SOLN
INTRAVENOUS | Status: DC | PRN
  Administered 2019-12-11: 2.5 mg via INTRAVENOUS

## 2019-12-11 MED ORDER — CEFAZOLIN SODIUM-DEXTROSE 2-4 GM/100ML-% IV SOLN
INTRAVENOUS | Status: AC
  Filled 2019-12-11: qty 100

## 2019-12-11 MED ORDER — CHLORHEXIDINE GLUCONATE 0.12 % MT SOLN
OROMUCOSAL | Status: AC
  Administered 2019-12-11: 15 mL via OROMUCOSAL
  Filled 2019-12-11: qty 15

## 2019-12-11 MED ORDER — VITAMIN D 25 MCG (1000 UNIT) PO TABS
1000.0000 [IU] | ORAL_TABLET | Freq: Every day | ORAL | Status: DC
  Administered 2019-12-12: 1000 [IU] via ORAL
  Filled 2019-12-11: qty 1

## 2019-12-11 MED ORDER — AMISULPRIDE (ANTIEMETIC) 5 MG/2ML IV SOLN: 10.0000 mg | Freq: Once | INTRAVENOUS | Status: DC | PRN

## 2019-12-11 MED ORDER — ACETAMINOPHEN 325 MG PO TABS: 325.0000 mg | ORAL_TABLET | ORAL | Status: DC | PRN

## 2019-12-11 MED ORDER — ASPIRIN EC 81 MG PO TBEC
81.0000 mg | DELAYED_RELEASE_TABLET | Freq: Every day | ORAL | Status: DC
  Administered 2019-12-12: 81 mg via ORAL
  Filled 2019-12-11: qty 1

## 2019-12-11 MED ORDER — LACTATED RINGERS IV SOLN: INTRAVENOUS | Status: DC

## 2019-12-11 MED ORDER — TIOTROPIUM BROMIDE MONOHYDRATE 2.5 MCG/ACT IN AERS: 2.0000 | INHALATION_SPRAY | Freq: Every day | RESPIRATORY_TRACT | Status: DC

## 2019-12-11 MED ORDER — OXYCODONE-ACETAMINOPHEN 5-325 MG PO TABS: 1.0000 | ORAL_TABLET | ORAL | Status: DC | PRN

## 2019-12-11 MED ORDER — ALBUMIN HUMAN 5 % IV SOLN
INTRAVENOUS | Status: AC
  Filled 2019-12-11: qty 250

## 2019-12-11 MED ORDER — GLYCOPYRROLATE PF 0.2 MG/ML IJ SOSY
PREFILLED_SYRINGE | INTRAMUSCULAR | Status: AC
  Filled 2019-12-11: qty 1

## 2019-12-11 MED ORDER — GEMFIBROZIL 600 MG PO TABS
600.0000 mg | ORAL_TABLET | Freq: Every day | ORAL | Status: DC
  Administered 2019-12-12: 600 mg via ORAL
  Filled 2019-12-11 (×2): qty 1

## 2019-12-11 MED ORDER — SUCCINYLCHOLINE CHLORIDE 200 MG/10ML IV SOSY
PREFILLED_SYRINGE | INTRAVENOUS | Status: AC
  Filled 2019-12-11: qty 10

## 2019-12-11 MED ORDER — LABETALOL HCL 5 MG/ML IV SOLN: 10.0000 mg | INTRAVENOUS | Status: DC | PRN

## 2019-12-11 MED ORDER — GLYCOPYRROLATE PF 0.2 MG/ML IJ SOSY
PREFILLED_SYRINGE | INTRAMUSCULAR | Status: DC | PRN
  Administered 2019-12-11 (×2): .2 mg via INTRAVENOUS

## 2019-12-11 MED ORDER — EPHEDRINE SULFATE-NACL 50-0.9 MG/10ML-% IV SOSY
PREFILLED_SYRINGE | INTRAVENOUS | Status: DC | PRN
  Administered 2019-12-11 (×3): 10 mg via INTRAVENOUS

## 2019-12-11 MED ORDER — PHENYLEPHRINE 40 MCG/ML (10ML) SYRINGE FOR IV PUSH (FOR BLOOD PRESSURE SUPPORT)
PREFILLED_SYRINGE | INTRAVENOUS | Status: AC
  Filled 2019-12-11: qty 10

## 2019-12-11 MED ORDER — 0.9 % SODIUM CHLORIDE (POUR BTL) OPTIME
TOPICAL | Status: DC | PRN
  Administered 2019-12-11: 1000 mL

## 2019-12-11 MED ORDER — SODIUM CHLORIDE 0.9 % IV SOLN
INTRAVENOUS | Status: AC
  Filled 2019-12-11: qty 1.2

## 2019-12-11 MED ORDER — POTASSIUM CHLORIDE CRYS ER 20 MEQ PO TBCR: 20.0000 meq | EXTENDED_RELEASE_TABLET | Freq: Every day | ORAL | Status: DC | PRN

## 2019-12-11 MED ORDER — HYDRALAZINE HCL 20 MG/ML IJ SOLN: 5.0000 mg | INTRAMUSCULAR | Status: DC | PRN

## 2019-12-11 MED ORDER — SODIUM CHLORIDE 0.9 % IV SOLN
0.0125 ug/kg/min | INTRAVENOUS | Status: AC
  Administered 2019-12-11: .15 ug/kg/min via INTRAVENOUS
  Filled 2019-12-11: qty 2000

## 2019-12-11 MED ORDER — ROCURONIUM BROMIDE 10 MG/ML (PF) SYRINGE
PREFILLED_SYRINGE | INTRAVENOUS | Status: DC | PRN
  Administered 2019-12-11: 20 mg via INTRAVENOUS
  Administered 2019-12-11: 50 mg via INTRAVENOUS
  Administered 2019-12-11: 20 mg via INTRAVENOUS

## 2019-12-11 MED ORDER — BISACODYL 10 MG RE SUPP: 10.0000 mg | Freq: Every day | RECTAL | Status: DC | PRN

## 2019-12-11 MED ORDER — CEFAZOLIN SODIUM-DEXTROSE 2-4 GM/100ML-% IV SOLN
2.0000 g | Freq: Three times a day (TID) | INTRAVENOUS | Status: AC
  Administered 2019-12-11 – 2019-12-12 (×2): 2 g via INTRAVENOUS
  Filled 2019-12-11 (×2): qty 100

## 2019-12-11 MED ORDER — DOCUSATE SODIUM 100 MG PO CAPS
100.0000 mg | ORAL_CAPSULE | Freq: Every day | ORAL | Status: DC
  Administered 2019-12-12: 100 mg via ORAL
  Filled 2019-12-11: qty 1

## 2019-12-11 MED ORDER — TAMSULOSIN HCL 0.4 MG PO CAPS: 0.4000 mg | ORAL_CAPSULE | Freq: Every day | ORAL | Status: DC

## 2019-12-11 MED ORDER — LIDOCAINE 2% (20 MG/ML) 5 ML SYRINGE
INTRAMUSCULAR | Status: DC | PRN
  Administered 2019-12-11: 60 mg via INTRAVENOUS

## 2019-12-11 MED ORDER — SUGAMMADEX SODIUM 200 MG/2ML IV SOLN
INTRAVENOUS | Status: DC | PRN
  Administered 2019-12-11: 200 mg via INTRAVENOUS

## 2019-12-11 MED ORDER — POLYETHYLENE GLYCOL 3350 17 G PO PACK: 17.0000 g | PACK | Freq: Every day | ORAL | Status: DC | PRN

## 2019-12-11 MED ORDER — ATORVASTATIN CALCIUM 10 MG PO TABS
20.0000 mg | ORAL_TABLET | Freq: Every day | ORAL | Status: DC
  Administered 2019-12-12: 20 mg via ORAL
  Filled 2019-12-11: qty 2

## 2019-12-11 MED ORDER — PHENOL 1.4 % MT LIQD: 1.0000 | OROMUCOSAL | Status: DC | PRN

## 2019-12-11 MED ORDER — CHLORHEXIDINE GLUCONATE CLOTH 2 % EX PADS: 6.0000 | MEDICATED_PAD | Freq: Once | CUTANEOUS | Status: DC

## 2019-12-11 MED ORDER — SODIUM CHLORIDE 0.9 % IV SOLN: INTRAVENOUS | Status: DC

## 2019-12-11 MED ORDER — PHENYLEPHRINE HCL-NACL 10-0.9 MG/250ML-% IV SOLN
INTRAVENOUS | Status: DC | PRN
  Administered 2019-12-11: 30 ug/min via INTRAVENOUS

## 2019-12-11 MED ORDER — FENTANYL CITRATE (PF) 100 MCG/2ML IJ SOLN: 25.0000 ug | INTRAMUSCULAR | Status: DC | PRN

## 2019-12-11 MED ORDER — PANTOPRAZOLE SODIUM 40 MG PO TBEC
40.0000 mg | DELAYED_RELEASE_TABLET | Freq: Every day | ORAL | Status: DC
  Filled 2019-12-11: qty 1

## 2019-12-11 MED ORDER — LEVOTHYROXINE SODIUM 25 MCG PO TABS
25.0000 ug | ORAL_TABLET | Freq: Every day | ORAL | Status: DC
  Administered 2019-12-12: 25 ug via ORAL
  Filled 2019-12-11: qty 1

## 2019-12-11 MED ORDER — ALBUMIN HUMAN 5 % IV SOLN
12.5000 g | Freq: Once | INTRAVENOUS | Status: AC
  Administered 2019-12-11: 12.5 g via INTRAVENOUS

## 2019-12-11 MED ORDER — GUAIFENESIN-DM 100-10 MG/5ML PO SYRP: 15.0000 mL | ORAL_SOLUTION | ORAL | Status: DC | PRN

## 2019-12-11 MED ORDER — LACTATED RINGERS IV SOLN: INTRAVENOUS | Status: DC | PRN

## 2019-12-11 MED ORDER — EPHEDRINE 5 MG/ML INJ
INTRAVENOUS | Status: AC
  Filled 2019-12-11: qty 10

## 2019-12-11 MED ORDER — ACETAMINOPHEN 650 MG RE SUPP: 325.0000 mg | RECTAL | Status: DC | PRN

## 2019-12-11 MED ORDER — FENTANYL CITRATE (PF) 250 MCG/5ML IJ SOLN
INTRAMUSCULAR | Status: AC
  Filled 2019-12-11: qty 5

## 2019-12-11 MED ORDER — ALUM & MAG HYDROXIDE-SIMETH 200-200-20 MG/5ML PO SUSP: 15.0000 mL | ORAL | Status: DC | PRN

## 2019-12-11 MED ORDER — HEPARIN SODIUM (PORCINE) 1000 UNIT/ML IJ SOLN
INTRAMUSCULAR | Status: AC
  Filled 2019-12-11: qty 1

## 2019-12-11 MED ORDER — METOPROLOL TARTRATE 5 MG/5ML IV SOLN: 2.0000 mg | INTRAVENOUS | Status: DC | PRN

## 2019-12-11 MED ORDER — DEXAMETHASONE SODIUM PHOSPHATE 10 MG/ML IJ SOLN
INTRAMUSCULAR | Status: AC
  Filled 2019-12-11: qty 1

## 2019-12-11 MED ORDER — PROTAMINE SULFATE 10 MG/ML IV SOLN
INTRAVENOUS | Status: DC | PRN
  Administered 2019-12-11: 50 mg via INTRAVENOUS

## 2019-12-11 MED ORDER — LIDOCAINE 2% (20 MG/ML) 5 ML SYRINGE
INTRAMUSCULAR | Status: AC
  Filled 2019-12-11: qty 5

## 2019-12-11 MED ORDER — CEFAZOLIN SODIUM-DEXTROSE 2-4 GM/100ML-% IV SOLN
2.0000 g | INTRAVENOUS | Status: AC
  Administered 2019-12-11: 2 g via INTRAVENOUS

## 2019-12-11 MED ORDER — PROPOFOL 10 MG/ML IV BOLUS
INTRAVENOUS | Status: AC
  Filled 2019-12-11: qty 20

## 2019-12-11 MED ORDER — HEPARIN SODIUM (PORCINE) 1000 UNIT/ML IJ SOLN
INTRAMUSCULAR | Status: DC | PRN
  Administered 2019-12-11: 9000 [IU] via INTRAVENOUS

## 2019-12-11 MED ORDER — PROPOFOL 10 MG/ML IV BOLUS
INTRAVENOUS | Status: DC | PRN
  Administered 2019-12-11: 150 mg via INTRAVENOUS

## 2019-12-11 MED ORDER — FENTANYL CITRATE (PF) 250 MCG/5ML IJ SOLN
INTRAMUSCULAR | Status: DC | PRN
Start: 2019-12-11 — End: 2019-12-11
  Administered 2019-12-11: 50 ug via INTRAVENOUS

## 2019-12-11 MED ORDER — SODIUM CHLORIDE 0.9 % IV SOLN: 500.0000 mL | Freq: Once | INTRAVENOUS | Status: DC | PRN

## 2019-12-11 MED ORDER — MORPHINE SULFATE (PF) 2 MG/ML IV SOLN: 2.0000 mg | INTRAVENOUS | Status: DC | PRN

## 2019-12-11 MED ORDER — LIDOCAINE HCL (PF) 1 % IJ SOLN
INTRAMUSCULAR | Status: AC
  Filled 2019-12-11: qty 5

## 2019-12-11 MED ORDER — ORAL CARE MOUTH RINSE: 15.0000 mL | Freq: Once | OROMUCOSAL | Status: AC

## 2019-12-11 MED ORDER — SODIUM CHLORIDE 0.9 % IV SOLN
INTRAVENOUS | Status: DC | PRN
  Administered 2019-12-11: 500 mL

## 2019-12-11 MED ORDER — ROCURONIUM BROMIDE 10 MG/ML (PF) SYRINGE
PREFILLED_SYRINGE | INTRAVENOUS | Status: AC
  Filled 2019-12-11: qty 10

## 2019-12-11 MED ORDER — ONDANSETRON HCL 4 MG/2ML IJ SOLN
INTRAMUSCULAR | Status: DC | PRN
  Administered 2019-12-11: 4 mg via INTRAVENOUS

## 2019-12-11 MED ORDER — ONDANSETRON HCL 4 MG/2ML IJ SOLN: 4.0000 mg | Freq: Four times a day (QID) | INTRAMUSCULAR | Status: DC | PRN

## 2019-12-11 MED ORDER — ALBUTEROL SULFATE HFA 108 (90 BASE) MCG/ACT IN AERS: 2.0000 | INHALATION_SPRAY | RESPIRATORY_TRACT | Status: DC | PRN

## 2019-12-11 MED ORDER — ONDANSETRON HCL 4 MG/2ML IJ SOLN
INTRAMUSCULAR | Status: AC
  Filled 2019-12-11: qty 2

## 2019-12-11 MED ORDER — CHLORHEXIDINE GLUCONATE 0.12 % MT SOLN: 15.0000 mL | Freq: Once | OROMUCOSAL | Status: AC

## 2019-12-11 MED ORDER — MAGNESIUM SULFATE 2 GM/50ML IV SOLN: 2.0000 g | Freq: Every day | INTRAVENOUS | Status: DC | PRN

## 2019-12-11 SURGICAL SUPPLY — 43 items
ADH SKN CLS APL DERMABOND .7 (GAUZE/BANDAGES/DRESSINGS) ×1
ADH SKN CLS LQ APL DERMABOND (GAUZE/BANDAGES/DRESSINGS) ×1
CANISTER SUCT 3000ML PPV (MISCELLANEOUS) ×3 IMPLANT
CANNULA VESSEL 3MM 2 BLNT TIP (CANNULA) ×6 IMPLANT
CATH ROBINSON RED A/P 18FR (CATHETERS) ×3 IMPLANT
CLIP LIGATING EXTRA MED SLVR (CLIP) ×3 IMPLANT
CLIP LIGATING EXTRA SM BLUE (MISCELLANEOUS) ×3 IMPLANT
COVER WAND RF STERILE (DRAPES) ×3 IMPLANT
DECANTER SPIKE VIAL GLASS SM (MISCELLANEOUS) IMPLANT
DERMABOND ADHESIVE PROPEN (GAUZE/BANDAGES/DRESSINGS) ×2
DERMABOND ADVANCED (GAUZE/BANDAGES/DRESSINGS) ×2
DERMABOND ADVANCED .7 DNX12 (GAUZE/BANDAGES/DRESSINGS) ×1 IMPLANT
DERMABOND ADVANCED .7 DNX6 (GAUZE/BANDAGES/DRESSINGS) ×1 IMPLANT
DRAIN HEMOVAC 1/8 X 5 (WOUND CARE) IMPLANT
ELECT REM PT RETURN 9FT ADLT (ELECTROSURGICAL) ×3
ELECTRODE REM PT RTRN 9FT ADLT (ELECTROSURGICAL) ×1 IMPLANT
EVACUATOR SILICONE 100CC (DRAIN) IMPLANT
GLOVE SS BIOGEL STRL SZ 7.5 (GLOVE) ×1 IMPLANT
GLOVE SUPERSENSE BIOGEL SZ 7.5 (GLOVE) ×2
GOWN STRL REUS W/ TWL LRG LVL3 (GOWN DISPOSABLE) ×3 IMPLANT
GOWN STRL REUS W/TWL LRG LVL3 (GOWN DISPOSABLE) ×9
KIT BASIN OR (CUSTOM PROCEDURE TRAY) ×3 IMPLANT
KIT SHUNT ARGYLE CAROTID ART 6 (VASCULAR PRODUCTS) IMPLANT
KIT TURNOVER KIT B (KITS) ×3 IMPLANT
NEEDLE 22X1 1/2 (OR ONLY) (NEEDLE) IMPLANT
NS IRRIG 1000ML POUR BTL (IV SOLUTION) ×6 IMPLANT
PACK CAROTID (CUSTOM PROCEDURE TRAY) ×3 IMPLANT
PAD ARMBOARD 7.5X6 YLW CONV (MISCELLANEOUS) ×6 IMPLANT
PATCH HEMASHIELD 8X75 (Vascular Products) ×3 IMPLANT
POSITIONER HEAD DONUT 9IN (MISCELLANEOUS) ×3 IMPLANT
SHUNT CAROTID BYPASS 10 (VASCULAR PRODUCTS) ×3 IMPLANT
SHUNT CAROTID BYPASS 12FRX15.5 (VASCULAR PRODUCTS) IMPLANT
SUT ETHILON 3 0 PS 1 (SUTURE) IMPLANT
SUT PROLENE 6 0 CC (SUTURE) ×6 IMPLANT
SUT SILK 3 0 (SUTURE)
SUT SILK 3-0 18XBRD TIE 12 (SUTURE) IMPLANT
SUT VIC AB 3-0 SH 27 (SUTURE) ×6
SUT VIC AB 3-0 SH 27X BRD (SUTURE) ×2 IMPLANT
SUT VICRYL 4-0 PS2 18IN ABS (SUTURE) ×3 IMPLANT
SYR BULB IRRIG 60ML STRL (SYRINGE) ×3 IMPLANT
SYR CONTROL 10ML LL (SYRINGE) IMPLANT
TOWEL GREEN STERILE (TOWEL DISPOSABLE) ×3 IMPLANT
WATER STERILE IRR 1000ML POUR (IV SOLUTION) ×3 IMPLANT

## 2019-12-11 NOTE — Anesthesia Procedure Notes (Addendum)
Procedure Name: Intubation Date/Time: 12/11/2019 7:46 AM Performed by: Darletta Moll, CRNA Pre-anesthesia Checklist: Patient identified, Emergency Drugs available, Suction available and Patient being monitored Patient Re-evaluated:Patient Re-evaluated prior to induction Oxygen Delivery Method: Circle system utilized Preoxygenation: Pre-oxygenation with 100% oxygen Induction Type: IV induction Ventilation: Mask ventilation without difficulty Laryngoscope Size: Mac and 4 Grade View: Grade III Tube type: Oral Tube size: 7.5 mm Number of attempts: 1 Airway Equipment and Method: Stylet Placement Confirmation: ETT inserted through vocal cords under direct vision,  positive ETCO2 and breath sounds checked- equal and bilateral Secured at: 23 cm Tube secured with: Tape Dental Injury: Teeth and Oropharynx as per pre-operative assessment  Difficulty Due To: Difficult Airway- due to anterior larynx Future Recommendations: Recommend- induction with short-acting agent, and alternative techniques readily available Comments: S Shaday Rayborn CRNA DLx1 no view obtained. Dr. Ola Spurr DL x1 Grade 3 view, successfule intubation with Mac 4

## 2019-12-11 NOTE — Interval H&P Note (Signed)
History and Physical Interval Note:  12/11/2019 7:03 AM  Marcus Herrera  has presented today for surgery, with the diagnosis of carotid stenosis.  The various methods of treatment have been discussed with the patient and family. After consideration of risks, benefits and other options for treatment, the patient has consented to  Procedure(s): ENDARTERECTOMY CAROTID RIGHT (Right) as a surgical intervention.  The patient's history has been reviewed, patient examined, no change in status, stable for surgery.  I have reviewed the patient's chart and labs.  Questions were answered to the patient's satisfaction.     Curt Jews

## 2019-12-11 NOTE — Progress Notes (Signed)
  Day of Surgery Note    Subjective:  Seen in his room-he has no complaints and denies any trouble swallowing   Vitals:   12/11/19 1109 12/11/19 1115  BP: (!) 96/58 90/60  Pulse: 61 65  Resp: 11 15  Temp:  98.7 F (37.1 C)  SpO2: 96% 98%    Incisions:   Clean and dry without hematoma Extremities:  Moving all extremities equally Cardiac:  regular Lungs:  Non labored Neuro:  In tact; tongue is midline   Assessment/Plan:  This is a 69 y.o. male who is s/p  Right carotid endarterectomy  -pt doing well and neuro in tact -anticipate discharge tomorrow if pt does well overnight.  -pt will f/u post op in [place] with Dr. Donnetta Hutching (message sent).   Leontine Locket, PA-C 12/11/2019 12:50 PM [phone]

## 2019-12-11 NOTE — Anesthesia Procedure Notes (Signed)
Arterial Line Insertion Start/End8/26/2021 6:45 AM Performed by: Darletta Moll, CRNA, CRNA  Patient location: Pre-op. Preanesthetic checklist: patient identified, IV checked, site marked, risks and benefits discussed, surgical consent, monitors and equipment checked, pre-op evaluation, timeout performed and anesthesia consent Lidocaine 1% used for infiltration Left, radial was placed Catheter size: 20 Fr Hand hygiene performed  and maximum sterile barriers used   Attempts: 1 Procedure performed without using ultrasound guided technique. Following insertion, dressing applied and Biopatch. Post procedure assessment: normal and unchanged  Patient tolerated the procedure well with no immediate complications.

## 2019-12-11 NOTE — Discharge Instructions (Signed)
   [facility] [place]  Discharge Instructions   Carotid Surgery  Please refer to the following instructions for your post-procedure care. Your surgeon or physician assistant will discuss any changes with you.  Activity  You are encouraged to walk as much as you can. You can slowly return to normal activities but must avoid strenuous activity and heavy lifting until your doctor tell you it's okay. Avoid activities such as vacuuming or swinging a golf club. You can drive after one week if you are comfortable and you are no longer taking prescription pain medications. It is normal to feel tired for serval weeks after your surgery. It is also normal to have difficulty with sleep habits, eating, and bowel movements after surgery. These will go away with time.  Bathing/Showering  Shower daily after you go home. Do not soak in a bathtub, hot tub, or swim until the incision heals completely.  Incision Care  Shower every day. Clean your incision with mild soap and water. Pat the area dry with a clean towel. You do not need a bandage unless otherwise instructed. Do not apply any ointments or creams to your incision. You may have skin glue on your incision. Do not peel it off. It will come off on its own in about one week. Your incision may feel thickened and raised for several weeks after your surgery. This is normal and the skin will soften over time.   For Men Only: It's okay to shave around the incision but do not shave the incision itself for 2 weeks. It is common to have numbness under your chin that could last for several months.  Diet  Resume your normal diet. There are no special food restrictions following this procedure. A low fat/low cholesterol diet is recommended for all patients with vascular disease. In order to heal from your surgery, it is CRITICAL to get adequate nutrition. Your body requires vitamins, minerals, and protein. Vegetables are the best source of  vitamins and minerals. Vegetables also provide the perfect balance of protein. Processed food has little nutritional value, so try to avoid this.  Medications  Resume taking all of your medications unless your doctor or physician assistant tells you not to. If your incision is causing pain, you may take over-the- counter pain relievers such as acetaminophen (Tylenol). If you were prescribed a stronger pain medication, please be aware these medications can cause nausea and constipation. Prevent nausea by taking the medication with a snack or meal. Avoid constipation by drinking plenty of fluids and eating foods with a high amount of fiber, such as fruits, vegetables, and grains.   Do not take Tylenol if you are taking prescription pain medications.  Follow Up  Our office will schedule a follow up appointment 2-3 weeks following discharge.  Please call us immediately for any of the following conditions  . Increased pain, redness, drainage (pus) from your incision site. . Fever of 101 degrees or higher. . If you should develop stroke (slurred speech, difficulty swallowing, weakness on one side of your body, loss of vision) you should call 911 and go to the nearest emergency room. .  Reduce your risk of vascular disease:  . Stop smoking. If you would like help call [facility] at 1-800-QUIT-NOW (1-800-784-8669) or [place] at [phone]. . Manage your cholesterol . Maintain a desired weight . Control your diabetes . Keep your blood pressure down .  If you have any questions, please call the office at [phone]. 

## 2019-12-11 NOTE — Progress Notes (Signed)
Pt arrived to room alert and oriented with no complaints of pain.  Right incision clean and dry.  All monitors attached, VS taken per order, CCMD notified and CHG bath given.  Pt oriented to unit and plan of care reviewed.  Wife escorted to room.  Will monitor closely

## 2019-12-11 NOTE — Op Note (Signed)
   OPERATIVE REPORT  DATE OF SURGERY: 12/11/2019  PATIENT: Marcus Herrera, 69 y.o. male MRN: [id number]  DOB: 09-04-1950  PRE-OPERATIVE DIAGNOSIS: Right Carotid Stenosis, Asymptomatic  POST-OPERATIVE DIAGNOSIS:  Same  PROCEDURE:  Right Carotid Endarterectomy with Dacron Patch Angioplasty  SURGEON:  Curt Jews, M.D.  PHYSICIAN ASSISTANT: Rhyne  The assistant was needed for exposure and to expedite the case  ANESTHESIA:   general  EBL: Less than 200 ml  Total I/O In: 1000 [I.V.:900; IV Piggyback:100] Out: 30 [Blood:30]  BLOOD ADMINISTERED: none  DRAINS: none   SPECIMEN: none  COUNTS CORRECT:  YES  PLAN OF CARE: Admit to inpatient   PATIENT DISPOSITION:  PACU - hemodynamically stable and neurologically intact.  PROCEDURE DETAILS: The patient was taken to the operating room placed in supine position.  General anesthesia was administered.  The neck was prepped and draped in the usual sterile fashion.  An incision was made anterior to the sternocleidomastoid and carried down through the platysma with electrocautery.  The sternocleidomastoid was reflected posteriorly and the carotid sheath was opened.  The facial vein was ligated with 2-0 silk ties and divided.  The common carotid artery was encircled with an umbilical tape and Rummel tourniquet.  The vagus nerve was identified and preserved.  Dissection was continued onto the carotid bifurcation.  The superior thyroid artery was encircled with a 2-0 silk Potts tie.  The external carotid was encircled with a blue vessel loop and the internal carotid was encircled with an umbilical tape and Rummel tourniquet.  The hypoglossal nerve was identified and preserved.  The patient was given systemic heparin and after adequate circulation time, the internal, external and common carotid arteries were occluded with vascular clamps.  The common carotid artery was opened with an 11 blade and extended  longitudinally with Potts scissors.  A 10 shunt  was passed up the internal carotid and allowed to backbleed.  It was then passed down the common carotid where it was secured with Rummel tourniquet.  The endarterectomy was begun on the common carotid artery and the plaque was divided proximally with Potts scissors.  The endarterectomy was continued onto the bifurcation.  The external carotid was endarterectomized with an eversion technique and the internal carotid was endarterectomized in an open fashion.  Remaining atheromatous debris was removed from the endarterectomy plane.  A Finesse Hemashield Dacron patch was brought onto the field and was sewn as a patch angioplasty with a running 6-0 Prolene suture.  Prior to completion of the closure the shunt was removed and the usual flushing maneuvers were undertaken.  The anastomosis was completed and flow was restored first to the external and then the internal carotid artery.  Excellent flow characteristics were noted with hand-held Doppler in the internal and external carotid arteries.  The patient was given 50 mg of protamine to reverse the heparin.  The wounds were irrigated with saline.  Hemostasis was obtained with electrocautery.  The wounds were closed with 3-0 Vicryl to reapproximate the sternocleidomastoid over the carotid sheath.  The platysma was lysed with a running 3-0 Vicryl suture.  The skin was closed with a 4-0 subcuticular Vicryl stitch.  Dermabond was applied.  The patient was awakened neurologically intact in the operating room and transferred to the recovery room in stable condition   Curt Jews, M.D. 12/11/2019 10:19 AM

## 2019-12-11 NOTE — Transfer of Care (Signed)
Immediate Anesthesia Transfer of Care Note  Patient: Marcus Herrera  Procedure(s) Performed: ENDARTERECTOMY CAROTID RIGHT (Right Neck) PATCH ANGIOPLASTY OF RIGHT CAROTID ARTERY USING HEMASHEILD PLATINUM FINESSE PATCH (Right Neck)  Patient Location: PACU  Anesthesia Type:General  Level of Consciousness: awake and patient cooperative  Airway & Oxygen Therapy: Patient Spontanous Breathing  Post-op Assessment: Report given to RN, Post -op Vital signs reviewed and stable and Patient moving all extremities X 4  Post vital signs: Reviewed and stable  Last Vitals:  Vitals Value Taken Time  BP    Temp    Pulse    Resp    SpO2      Last Pain:  Vitals:   12/11/19 0622  TempSrc:   PainSc: 0-No pain      Patients Stated Pain Goal: 4 (95/63/87 5643)  Complications: No complications documented.

## 2019-12-11 NOTE — Anesthesia Postprocedure Evaluation (Signed)
Anesthesia Post Note  Patient: Marcus Herrera  Procedure(s) Performed: ENDARTERECTOMY CAROTID RIGHT (Right Neck) PATCH ANGIOPLASTY OF RIGHT CAROTID ARTERY USING HEMASHEILD PLATINUM FINESSE PATCH (Right Neck)     Patient location during evaluation: PACU Anesthesia Type: General Level of consciousness: awake and alert Pain management: pain level controlled Vital Signs Assessment: post-procedure vital signs reviewed and stable Respiratory status: spontaneous breathing, nonlabored ventilation, respiratory function stable and patient connected to nasal cannula oxygen Cardiovascular status: blood pressure returned to baseline and stable Postop Assessment: no apparent nausea or vomiting Anesthetic complications: no   No complications documented.  Last Vitals:  Vitals:   12/11/19 1109 12/11/19 1115  BP: (!) 96/58 90/60  Pulse: 61 65  Resp: 11 15  Temp:  37.1 C  SpO2: 96% 98%    Last Pain:  Vitals:   12/11/19 1115  TempSrc:   PainSc: 3                  Tiajuana Amass

## 2019-12-12 ENCOUNTER — Encounter (HOSPITAL_COMMUNITY): Payer: Self-pay | Admitting: Vascular Surgery

## 2019-12-12 LAB — BASIC METABOLIC PANEL
Anion gap: 10 (ref 5–15)
BUN: 24 mg/dL — ABNORMAL HIGH (ref 8–23)
CO2: 19 mmol/L — ABNORMAL LOW (ref 22–32)
Calcium: 9.1 mg/dL (ref 8.9–10.3)
Chloride: 108 mmol/L (ref 98–111)
Creatinine, Ser: 1.64 mg/dL — ABNORMAL HIGH (ref 0.61–1.24)
GFR calc Af Amer: 48 mL/min — ABNORMAL LOW (ref >60)
GFR calc non Af Amer: 41 mL/min — ABNORMAL LOW (ref >60)
Glucose, Bld: 131 mg/dL — ABNORMAL HIGH (ref 70–99)
Potassium: 4.5 mmol/L (ref 3.5–5.1)
Sodium: 137 mmol/L (ref 135–145)

## 2019-12-12 LAB — CBC
HCT: 35 % — ABNORMAL LOW (ref 39.0–52.0)
Hemoglobin: 12.3 g/dL — ABNORMAL LOW (ref 13.0–17.0)
MCH: 31.3 pg (ref 26.0–34.0)
MCHC: 35.1 g/dL (ref 30.0–36.0)
MCV: 89.1 fL (ref 80.0–100.0)
Platelets: 125 10*3/uL — ABNORMAL LOW (ref 150–400)
RBC: 3.93 MIL/uL — ABNORMAL LOW (ref 4.22–5.81)
RDW: 13.2 % (ref 11.5–15.5)
WBC: 9.3 10*3/uL (ref 4.0–10.5)
nRBC: 0 % (ref 0.0–0.2)

## 2019-12-12 MED ORDER — SODIUM CHLORIDE 0.9 % IV BOLUS: 500.0000 mL | Freq: Once | INTRAVENOUS | Status: DC

## 2019-12-12 MED ORDER — HYDROCODONE-ACETAMINOPHEN 5-325 MG PO TABS
1.0000 | ORAL_TABLET | Freq: Four times a day (QID) | ORAL | 0 refills | Status: DC | PRN
Start: 2019-12-12 — End: 2019-12-29

## 2019-12-12 NOTE — Progress Notes (Signed)
  Progress Note    12/12/2019 7:51 AM 1 Day Post-Op  Subjective: no complaints. Sitting up in chair eating   Vitals:   12/12/19 0200 12/12/19 0320  BP: (!) 98/59 118/73  Pulse: (!) 40 (!) 43  Resp: 17 18  Temp: 98.4 F (36.9 C) 98.8 F (37.1 C)  SpO2: 96% 99%   Physical Exam: Cardiac: sinus brady Lungs:  Non labored Incisions: right neck incision clean, dry and intact. No swelling or hematoma. There is some dry blood from inferior aspect of incision but no active bleeding or drainage Extremities: moving all extremities without deficits Neurologic: CN intact. Smile symmetric. Tongue midline. Symmetric upper extremity lower extremity motor and strength   CBC    Component Value Date/Time   WBC 9.3 12/12/2019 0323   RBC 3.93 (L) 12/12/2019 0323   HGB 12.3 (L) 12/12/2019 0323   HCT 35.0 (L) 12/12/2019 0323   PLT 125 (L) 12/12/2019 0323   MCV 89.1 12/12/2019 0323   MCH 31.3 12/12/2019 0323   MCHC 35.1 12/12/2019 0323   RDW 13.2 12/12/2019 0323    BMET    Component Value Date/Time   NA 137 12/12/2019 0323   K 4.5 12/12/2019 0323   CL 108 12/12/2019 0323   CO2 19 (L) 12/12/2019 0323   GLUCOSE 131 (H) 12/12/2019 0323   BUN 24 (H) 12/12/2019 0323   CREATININE 1.64 (H) 12/12/2019 0323   CALCIUM 9.1 12/12/2019 0323   GFRNONAA 41 (L) 12/12/2019 0323   GFRAA 48 (L) 12/12/2019 0323    INR    Component Value Date/Time   INR 1.0 12/08/2019 1326     Intake/Output Summary (Last 24 hours) at 12/12/2019 0751 Last data filed at 12/11/2019 2200 Gross per 24 hour  Intake 2193.21 ml  Output 650 ml  Net 1543.21 ml     Assessment/Plan:  69 y.o. male is s/p right CEA 1 Day Post-Op. Doing well post op. Neurologically intact. Tolerating diet. Ambulating without difficulty. He is hypotensive and sinus brady in the 40's. Asymptomatic. I think he is at his baseline. Will give NS bolus prior to d/c. He otherwise is stable for discharge. He will continue Aspirin and Statin. Follow  up with Dr. Donnetta Hutching in 3 weeks has already been arranged    Karoline Caldwell, PA-C [facility] [phone] 12/12/2019 7:51 AM

## 2019-12-12 NOTE — Progress Notes (Signed)
Pt discharging home with wife.  All instructions reviewed, all questions answered

## 2019-12-12 NOTE — Discharge Summary (Signed)
Carotid Discharge Summary     Marcus Herrera [birth year] 69 y.o. male  [id number]  Admission Date: 12/11/2019  Discharge Date: 12/12/2019  Physician: No att. providers found  Admission Diagnosis: Carotid stenosis [I65.29] Asymptomatic carotid artery stenosis without infarction, right [facility]  Hospital Course:  The patient was admitted to the hospital and taken to the operating room on 12/11/2019 and underwent right carotid endarterectomy for asymptomatic severe right carotid artery stenosis.  The pt tolerated the procedure well and was transported to the PACU in excellent condition.   By POD 1, the pt neuro status remained intact. He tolerated diet and ambulated without difficulty. Had some hypotension and sinus bradycardia but asymptomatic. This seems at his baseline.  The remainder of the hospital course consisted of increasing mobilization and increasing intake of solids without difficulty.  He was given NS bolus with some improvement in blood pressure and heart rate remained at his baseline. He remained stable for discharge home. He will continue his regular home medications as prescribed. He will continue his aspirin and statin. PDMP was reviewed and patient was sent prescription for post operative pain medication to his pharmacy. He will follow up with Dr. Donnetta Hutching in [place] in 3 weeks   Recent Labs    12/12/19 0323  NA 137  K 4.5  CL 108  CO2 19*  GLUCOSE 131*  BUN 24*  CALCIUM 9.1   Recent Labs    12/12/19 0323  WBC 9.3  HGB 12.3*  HCT 35.0*  PLT 125*   No results for input(s): INR in the last 72 hours.   Discharge Instructions    Discharge patient   Complete by: As directed    After NS bolus   Discharge disposition: 01-Home or Self Care   Discharge patient date: 12/12/2019      Discharge Diagnosis:  Carotid stenosis [I65.29] Asymptomatic carotid artery stenosis without infarction, right [I65.21]  Secondary Diagnosis: Patient Active Problem List     Diagnosis Date Noted  . Carotid stenosis 12/11/2019  . Asymptomatic carotid artery stenosis without infarction, right 12/11/2019  . Syncope 06/26/2016   Past Medical History:  Diagnosis Date  . Arthritis   . Chronic kidney disease   . COPD (chronic obstructive pulmonary disease) ([place])   . Dyspnea    on exertion at times  . Hypertension   . Hypothyroidism   . Thyroid disease   . Vertigo     Allergies as of 12/12/2019      Reactions   Sulfa Antibiotics Hives      Medication List    TAKE these medications   albuterol 108 (90 Base) MCG/ACT inhaler Commonly known as: VENTOLIN HFA Inhale 2 puffs into the lungs every 4 (four) hours as needed for wheezing or shortness of breath.   aspirin 81 MG tablet Take 81 mg by mouth daily.   atorvastatin 20 MG tablet Commonly known as: LIPITOR Take 20 mg by mouth daily.   cholecalciferol 1000 units tablet Commonly known as: VITAMIN D Take 1,000 Units by mouth daily.   co-enzyme Q-10 30 MG capsule Take 30 mg by mouth daily.   Fish Oil 1000 MG Cpdr Take 1,000 mg by mouth daily.   gemfibrozil 600 MG tablet Commonly known as: LOPID Take 600 mg by mouth daily.   HYDROcodone-acetaminophen 5-325 MG tablet Commonly known as: NORCO/VICODIN Take 1 tablet by mouth every 6 (six) hours as needed for moderate pain.   levothyroxine 25 MCG tablet Commonly known as: SYNTHROID Take 25 mcg by mouth  daily before breakfast.   Spiriva Respimat 2.5 MCG/ACT Aers Generic drug: Tiotropium Bromide Monohydrate Inhale 2 puffs into the lungs daily.   tadalafil 20 MG tablet Commonly known as: CIALIS Take 20 mg by mouth daily as needed for erectile dysfunction.   tamsulosin 0.4 MG Caps capsule Commonly known as: FLOMAX Take 0.4 mg by mouth daily.        Discharge Instructions:   [facility] Discharge Instructions Carotid Endarterectomy (CEA)  Please refer to the following instructions for your  post-procedure care. Your surgeon or physician assistant will discuss any changes with you.  Activity  You are encouraged to walk as much as you can. You can slowly return to normal activities but must avoid strenuous activity and heavy lifting until your doctor tell you it's OK. Avoid activities such as vacuuming or swinging a golf club. You can drive after one week if you are comfortable and you are no longer taking prescription pain medications. It is normal to feel tired for serval weeks after your surgery. It is also normal to have difficulty with sleep habits, eating, and bowel movements after surgery. These will go away with time.  Bathing/Showering  You may shower after you come home. Do not soak in a bathtub, hot tub, or swim until the incision heals completely.  Incision Care  Shower every day. Clean your incision with mild soap and water. Pat the area dry with a clean towel. You do not need a bandage unless otherwise instructed. Do not apply any ointments or creams to your incision. You may have skin glue on your incision. Do not peel it off. It will come off on its own in about one week. Your incision may feel thickened and raised for several weeks after your surgery. This is normal and the skin will soften over time. For Men Only: It's OK to shave around the incision but do not shave the incision itself for 2 weeks. It is common to have numbness under your chin that could last for several months.  Diet  Resume your normal diet. There are no special food restrictions following this procedure. A low fat/low cholesterol diet is recommended for all patients with vascular disease. In order to heal from your surgery, it is CRITICAL to get adequate nutrition. Your body requires vitamins, minerals, and protein. Vegetables are the best source of vitamins and minerals. Vegetables also provide the perfect balance of protein. Processed food has little nutritional value, so try to avoid  this.  Medications  Resume taking all of your medications unless your doctor or physician assistant tells you not to.  If your incision is causing pain, you may take over-the- counter pain relievers such as acetaminophen (Tylenol). If you were prescribed a stronger pain medication, please be aware these medications can cause nausea and constipation.  Prevent nausea by taking the medication with a snack or meal. Avoid constipation by drinking plenty of fluids and eating foods with a high amount of fiber, such as fruits, vegetables, and grains. Do not take Tylenol if you are taking prescription pain medications.  Follow Up  Our office will schedule a follow up appointment 2-3 weeks following discharge.  Please call us immediately for any of the following conditions  . Increased pain, redness, drainage (pus) from your incision site. . Fever of 101 degrees or higher. . If you should develop stroke (slurred speech, difficulty swallowing, weakness on one side of your body, loss of vision) you should call 911  and go to the nearest emergency room. .  Reduce your risk of vascular disease:  . Stop smoking. If you would like help call [facility] at 1-800-QUIT-NOW 680-029-5143) or [place] at [phone]. . Manage your cholesterol . Maintain a desired weight . Control your diabetes . Keep your blood pressure down .  If you have any questions, please call the office at [phone].  Prescriptions given: Hydrocodone- Acetaminophen #12 No Refill  Disposition: Home  Patient's condition: is Excellent  Follow up: 1. Dr. Donnetta Hutching in 3 weeks.   Lucielle Vokes PA-C [facility] [phone]   --- For [facility] Registry use ---   Modified Rankin score at D/C (0-6): 0  IV medication needed for:  1. Hypertension: No 2. Hypotension: Yes  Post-op Complications: No  1. Post-op CVA or TIA: No  If yes: Event classification (right eye, left eye, right cortical, left cortical,  verterobasilar, other): n/a  If yes: Timing of event (intra-op, <6 hrs post-op, >=6 hrs post-op, unknown): n/a  2. CN injury: No  If yes: CN n/a injuried   3. Myocardial infarction: No  If yes: Dx by (EKG or clinical, Troponin): n/a  4.  CHF: No  5.  Dysrhythmia (new): No  6. Wound infection: No  7. Reperfusion symptoms: No  8. Return to OR: No  If yes: return to OR for (bleeding, neurologic, other CEA incision, other): n/a   Discharge medications: Statin use:  Yes ASA use:  Yes   Beta blocker use:  No ACE-Inhibitor use:  No  ARB use:  No CCB use: No P2Y12 Antagonist use: No, [ ]  Plavix, [ ]  Plasugrel, [ ]  Ticlopinine, [ ]  Ticagrelor, [ ]  Other, [ ]  No for medical reason, [ ]  Non-compliant, [ ]  Not-indicated Anti-coagulant use:  No, [ ]  Warfarin, [ ]  Rivaroxaban, [ ]  Dabigatran,

## 2019-12-12 NOTE — Progress Notes (Signed)
   12/12/19 0000  Vitals  Temp 98.1 F (36.7 C)  Temp Source Oral  BP (!) 98/55  MAP (mmHg) 68  BP Location Right Arm  BP Method Automatic  Patient Position (if appropriate) Lying  Pulse Rate (!) 43  Pulse Rate Source Monitor  ECG Heart Rate (!) 43  Resp 16  MEWS COLOR  MEWS Score Color Yellow  Oxygen Therapy  SpO2 97 %  O2 Device Room Air   Yellow MEWS protocol started. Pt alert and oriented, sinus brady in 40s @ baseline. BPs soft at baseline. Pt is alert and oriented x4. No complaints. Will continue frequent vitals  Jaymes Graff, RN

## 2019-12-16 DIAGNOSIS — E7849 Other hyperlipidemia: Secondary | ICD-10-CM | POA: Diagnosis not present

## 2019-12-16 DIAGNOSIS — I129 Hypertensive chronic kidney disease with stage 1 through stage 4 chronic kidney disease, or unspecified chronic kidney disease: Secondary | ICD-10-CM | POA: Diagnosis not present

## 2019-12-16 DIAGNOSIS — N183 Chronic kidney disease, stage 3 unspecified: Secondary | ICD-10-CM | POA: Diagnosis not present

## 2019-12-19 DIAGNOSIS — N183 Chronic kidney disease, stage 3 unspecified: Secondary | ICD-10-CM | POA: Diagnosis not present

## 2019-12-25 DIAGNOSIS — N183 Chronic kidney disease, stage 3 unspecified: Secondary | ICD-10-CM | POA: Diagnosis not present

## 2019-12-25 DIAGNOSIS — I129 Hypertensive chronic kidney disease with stage 1 through stage 4 chronic kidney disease, or unspecified chronic kidney disease: Secondary | ICD-10-CM | POA: Diagnosis not present

## 2019-12-25 DIAGNOSIS — N2581 Secondary hyperparathyroidism of renal origin: Secondary | ICD-10-CM | POA: Diagnosis not present

## 2019-12-25 DIAGNOSIS — D631 Anemia in chronic kidney disease: Secondary | ICD-10-CM | POA: Diagnosis not present

## 2019-12-29 ENCOUNTER — Ambulatory Visit (INDEPENDENT_AMBULATORY_CARE_PROVIDER_SITE_OTHER): Payer: Self-pay | Admitting: Vascular Surgery

## 2019-12-29 ENCOUNTER — Encounter: Payer: Self-pay | Admitting: Vascular Surgery

## 2019-12-29 ENCOUNTER — Other Ambulatory Visit: Payer: Self-pay

## 2019-12-29 VITALS — BP 125/78 | HR 68 | Temp 98.7°F | Resp 12 | Ht 72.0 in | Wt 195.0 lb

## 2019-12-29 DIAGNOSIS — I6529 Occlusion and stenosis of unspecified carotid artery: Secondary | ICD-10-CM

## 2019-12-29 NOTE — Progress Notes (Signed)
   Patient name: Marcus Herrera MRN: [id number] DOB: [birth year] Sex: male   Seen today in our [place] office  REASON FOR VISIT: Follow-up right carotid endarterectomy on 12/11/2019  HPI: Marcus Herrera is a 69 y.o. male here today for follow-up.  Underwent uneventful endarterectomy for severe asymptomatic disease on 12/11/2019 and was discharged home on postoperative day 1.  He has had no postoperative difficulty.  No neurologic deficits.  Does have the usual periincisional numbness.  Current Outpatient Medications  Medication Sig Dispense Refill  . albuterol (PROVENTIL HFA;VENTOLIN HFA) 108 (90 Base) MCG/ACT inhaler Inhale 2 puffs into the lungs every 4 (four) hours as needed for wheezing or shortness of breath.    Marland Kitchen aspirin 81 MG tablet Take 81 mg by mouth daily.    Marland Kitchen atorvastatin (LIPITOR) 20 MG tablet Take 20 mg by mouth daily.     . cholecalciferol (VITAMIN D) 1000 units tablet Take 1,000 Units by mouth daily.    Marland Kitchen co-enzyme Q-10 30 MG capsule Take 30 mg by mouth daily.     Marland Kitchen gemfibrozil (LOPID) 600 MG tablet Take 600 mg by mouth daily.    Marland Kitchen levothyroxine (SYNTHROID) 25 MCG tablet Take 25 mcg by mouth daily before breakfast.     . Omega-3 Fatty Acids (FISH OIL) 1000 MG CPDR Take 1,000 mg by mouth daily.     . tadalafil (CIALIS) 20 MG tablet Take 20 mg by mouth daily as needed for erectile dysfunction.     . tamsulosin (FLOMAX) 0.4 MG CAPS capsule Take 0.4 mg by mouth daily.     . Tiotropium Bromide Monohydrate (SPIRIVA RESPIMAT) 2.5 MCG/ACT AERS Inhale 2 puffs into the lungs daily.     No current facility-administered medications for this visit.     PHYSICAL EXAM: Vitals:   12/29/19 0843  BP: 125/78  Pulse: 68  Resp: 12  Temp: 98.7 F (37.1 C)  TempSrc: Oral  SpO2: 96%  Weight: 195 lb (88.5 kg)  Height: 6' (1.829 m)    GENERAL: The patient is a well-nourished male, in no acute distress. The vital signs are documented above. Right neck  incision well-healed.  No bruits bilaterally  MEDICAL ISSUES: Stable status post right carotid endarterectomy.  known contralateral left internal carotid occlusion. Will resume full activities.  Will see [place] again in 9 months with repeat carotid duplex  Rosetta Posner, MD [facility] Tel [phone] Pager [phone]

## 2020-01-02 ENCOUNTER — Other Ambulatory Visit: Payer: Self-pay | Admitting: *Deleted

## 2020-01-02 DIAGNOSIS — I6529 Occlusion and stenosis of unspecified carotid artery: Secondary | ICD-10-CM

## 2020-01-15 DIAGNOSIS — N183 Chronic kidney disease, stage 3 unspecified: Secondary | ICD-10-CM | POA: Diagnosis not present

## 2020-01-15 DIAGNOSIS — E7849 Other hyperlipidemia: Secondary | ICD-10-CM | POA: Diagnosis not present

## 2020-01-15 DIAGNOSIS — I129 Hypertensive chronic kidney disease with stage 1 through stage 4 chronic kidney disease, or unspecified chronic kidney disease: Secondary | ICD-10-CM | POA: Diagnosis not present

## 2020-02-10 DIAGNOSIS — Z23 Encounter for immunization: Secondary | ICD-10-CM | POA: Diagnosis not present

## 2020-02-14 DIAGNOSIS — E7849 Other hyperlipidemia: Secondary | ICD-10-CM | POA: Diagnosis not present

## 2020-02-14 DIAGNOSIS — I129 Hypertensive chronic kidney disease with stage 1 through stage 4 chronic kidney disease, or unspecified chronic kidney disease: Secondary | ICD-10-CM | POA: Diagnosis not present

## 2020-02-14 DIAGNOSIS — N183 Chronic kidney disease, stage 3 unspecified: Secondary | ICD-10-CM | POA: Diagnosis not present

## 2020-02-24 DIAGNOSIS — L570 Actinic keratosis: Secondary | ICD-10-CM | POA: Diagnosis not present

## 2020-02-24 DIAGNOSIS — L814 Other melanin hyperpigmentation: Secondary | ICD-10-CM | POA: Diagnosis not present

## 2020-02-24 DIAGNOSIS — I781 Nevus, non-neoplastic: Secondary | ICD-10-CM | POA: Diagnosis not present

## 2020-02-24 DIAGNOSIS — L821 Other seborrheic keratosis: Secondary | ICD-10-CM | POA: Diagnosis not present

## 2020-03-16 DIAGNOSIS — I129 Hypertensive chronic kidney disease with stage 1 through stage 4 chronic kidney disease, or unspecified chronic kidney disease: Secondary | ICD-10-CM | POA: Diagnosis not present

## 2020-03-16 DIAGNOSIS — E7849 Other hyperlipidemia: Secondary | ICD-10-CM | POA: Diagnosis not present

## 2020-03-16 DIAGNOSIS — N183 Chronic kidney disease, stage 3 unspecified: Secondary | ICD-10-CM | POA: Diagnosis not present

## 2020-04-05 DIAGNOSIS — J449 Chronic obstructive pulmonary disease, unspecified: Secondary | ICD-10-CM | POA: Diagnosis not present

## 2020-04-05 DIAGNOSIS — E782 Mixed hyperlipidemia: Secondary | ICD-10-CM | POA: Diagnosis not present

## 2020-04-05 DIAGNOSIS — R739 Hyperglycemia, unspecified: Secondary | ICD-10-CM | POA: Diagnosis not present

## 2020-04-05 DIAGNOSIS — E7800 Pure hypercholesterolemia, unspecified: Secondary | ICD-10-CM | POA: Diagnosis not present

## 2020-04-05 DIAGNOSIS — I6529 Occlusion and stenosis of unspecified carotid artery: Secondary | ICD-10-CM | POA: Diagnosis not present

## 2020-04-05 DIAGNOSIS — E039 Hypothyroidism, unspecified: Secondary | ICD-10-CM | POA: Diagnosis not present

## 2020-04-05 DIAGNOSIS — Z1331 Encounter for screening for depression: Secondary | ICD-10-CM | POA: Diagnosis not present

## 2020-04-05 DIAGNOSIS — N189 Chronic kidney disease, unspecified: Secondary | ICD-10-CM | POA: Diagnosis not present

## 2020-04-05 DIAGNOSIS — N183 Chronic kidney disease, stage 3 unspecified: Secondary | ICD-10-CM | POA: Diagnosis not present

## 2020-04-05 DIAGNOSIS — I10 Essential (primary) hypertension: Secondary | ICD-10-CM | POA: Diagnosis not present

## 2020-04-05 DIAGNOSIS — Z1389 Encounter for screening for other disorder: Secondary | ICD-10-CM | POA: Diagnosis not present

## 2020-04-05 DIAGNOSIS — Z0000 Encounter for general adult medical examination without abnormal findings: Secondary | ICD-10-CM | POA: Diagnosis not present

## 2020-04-05 DIAGNOSIS — Z0001 Encounter for general adult medical examination with abnormal findings: Secondary | ICD-10-CM | POA: Diagnosis not present

## 2020-04-16 DIAGNOSIS — I129 Hypertensive chronic kidney disease with stage 1 through stage 4 chronic kidney disease, or unspecified chronic kidney disease: Secondary | ICD-10-CM | POA: Diagnosis not present

## 2020-04-16 DIAGNOSIS — N183 Chronic kidney disease, stage 3 unspecified: Secondary | ICD-10-CM | POA: Diagnosis not present

## 2020-04-16 DIAGNOSIS — E7849 Other hyperlipidemia: Secondary | ICD-10-CM | POA: Diagnosis not present

## 2020-05-15 DIAGNOSIS — I129 Hypertensive chronic kidney disease with stage 1 through stage 4 chronic kidney disease, or unspecified chronic kidney disease: Secondary | ICD-10-CM | POA: Diagnosis not present

## 2020-05-15 DIAGNOSIS — N183 Chronic kidney disease, stage 3 unspecified: Secondary | ICD-10-CM | POA: Diagnosis not present

## 2020-05-15 DIAGNOSIS — E7849 Other hyperlipidemia: Secondary | ICD-10-CM | POA: Diagnosis not present

## 2020-06-14 DIAGNOSIS — N183 Chronic kidney disease, stage 3 unspecified: Secondary | ICD-10-CM | POA: Diagnosis not present

## 2020-06-14 DIAGNOSIS — E7849 Other hyperlipidemia: Secondary | ICD-10-CM | POA: Diagnosis not present

## 2020-06-14 DIAGNOSIS — I129 Hypertensive chronic kidney disease with stage 1 through stage 4 chronic kidney disease, or unspecified chronic kidney disease: Secondary | ICD-10-CM | POA: Diagnosis not present

## 2020-07-14 DIAGNOSIS — E7849 Other hyperlipidemia: Secondary | ICD-10-CM | POA: Diagnosis not present

## 2020-07-14 DIAGNOSIS — N183 Chronic kidney disease, stage 3 unspecified: Secondary | ICD-10-CM | POA: Diagnosis not present

## 2020-07-14 DIAGNOSIS — I129 Hypertensive chronic kidney disease with stage 1 through stage 4 chronic kidney disease, or unspecified chronic kidney disease: Secondary | ICD-10-CM | POA: Diagnosis not present

## 2020-07-19 DIAGNOSIS — Z1211 Encounter for screening for malignant neoplasm of colon: Secondary | ICD-10-CM | POA: Diagnosis not present

## 2020-07-27 DIAGNOSIS — N183 Chronic kidney disease, stage 3 unspecified: Secondary | ICD-10-CM | POA: Diagnosis not present

## 2020-08-02 DIAGNOSIS — I129 Hypertensive chronic kidney disease with stage 1 through stage 4 chronic kidney disease, or unspecified chronic kidney disease: Secondary | ICD-10-CM | POA: Diagnosis not present

## 2020-08-02 DIAGNOSIS — N183 Chronic kidney disease, stage 3 unspecified: Secondary | ICD-10-CM | POA: Diagnosis not present

## 2020-08-02 DIAGNOSIS — N2581 Secondary hyperparathyroidism of renal origin: Secondary | ICD-10-CM | POA: Diagnosis not present

## 2020-08-02 DIAGNOSIS — D631 Anemia in chronic kidney disease: Secondary | ICD-10-CM | POA: Diagnosis not present

## 2020-08-14 DIAGNOSIS — N183 Chronic kidney disease, stage 3 unspecified: Secondary | ICD-10-CM | POA: Diagnosis not present

## 2020-08-14 DIAGNOSIS — I129 Hypertensive chronic kidney disease with stage 1 through stage 4 chronic kidney disease, or unspecified chronic kidney disease: Secondary | ICD-10-CM | POA: Diagnosis not present

## 2020-08-14 DIAGNOSIS — E7849 Other hyperlipidemia: Secondary | ICD-10-CM | POA: Diagnosis not present

## 2020-09-09 ENCOUNTER — Other Ambulatory Visit: Payer: Self-pay | Admitting: *Deleted

## 2020-09-09 DIAGNOSIS — I6529 Occlusion and stenosis of unspecified carotid artery: Secondary | ICD-10-CM

## 2020-09-13 DIAGNOSIS — I129 Hypertensive chronic kidney disease with stage 1 through stage 4 chronic kidney disease, or unspecified chronic kidney disease: Secondary | ICD-10-CM | POA: Diagnosis not present

## 2020-09-13 DIAGNOSIS — E7849 Other hyperlipidemia: Secondary | ICD-10-CM | POA: Diagnosis not present

## 2020-09-13 DIAGNOSIS — N183 Chronic kidney disease, stage 3 unspecified: Secondary | ICD-10-CM | POA: Diagnosis not present

## 2020-09-27 ENCOUNTER — Ambulatory Visit: Payer: PPO | Admitting: Vascular Surgery

## 2020-10-04 ENCOUNTER — Ambulatory Visit: Payer: PPO | Admitting: Vascular Surgery

## 2020-10-11 ENCOUNTER — Ambulatory Visit (INDEPENDENT_AMBULATORY_CARE_PROVIDER_SITE_OTHER): Payer: PPO

## 2020-10-11 ENCOUNTER — Other Ambulatory Visit: Payer: Self-pay

## 2020-10-11 ENCOUNTER — Encounter: Payer: Self-pay | Admitting: Vascular Surgery

## 2020-10-11 ENCOUNTER — Ambulatory Visit: Payer: PPO | Admitting: Vascular Surgery

## 2020-10-11 VITALS — BP 144/80 | HR 66 | Temp 98.4°F | Resp 16 | Ht 72.0 in | Wt 195.0 lb

## 2020-10-11 DIAGNOSIS — I6529 Occlusion and stenosis of unspecified carotid artery: Secondary | ICD-10-CM | POA: Diagnosis not present

## 2020-10-11 NOTE — Progress Notes (Signed)
[facility] [place]  Patient name: Marcus Herrera MRN: [id number] DOB: 14-Jul-1950 Sex: male  REASON FOR VISIT: Follow-up carotid disease  HPI: Marcus Herrera is a 70 y.o. male here today for follow-up.  Underwent right carotid endarterectomy for severe asymptomatic disease on 12/11/2019.  He has a known left internal carotid artery occlusion.  He continues to do quite well.  He has had no new neurologic deficits  Past Medical History:  Diagnosis Date   Arthritis    Chronic kidney disease    COPD (chronic obstructive pulmonary disease) (HCC)    Dyspnea    on exertion at times   Hypertension    Hypothyroidism    Thyroid disease    Vertigo     History reviewed. No pertinent family history.  SOCIAL HISTORY: Social History   Tobacco Use   Smoking status: Former    Packs/day: 1.00    Years: 30.00    Pack years: 30.00    Types: Cigarettes    Quit date: 06/19/2008    Years since quitting: 12.3   Smokeless tobacco: Never  Substance Use Topics   Alcohol use: Yes    Alcohol/week: 1.0 - 3.0 standard drink    Types: 1 - 3 Cans of beer per week    Comment: daily     Allergies  Allergen Reactions   Sulfa Antibiotics Hives    Current Outpatient Medications  Medication Sig Dispense Refill   albuterol (PROVENTIL HFA;VENTOLIN HFA) 108 (90 Base) MCG/ACT inhaler Inhale 2 puffs into the lungs every 4 (four) hours as needed for wheezing or shortness of breath.     aspirin 81 MG tablet Take 81 mg by mouth daily.     atorvastatin (LIPITOR) 20 MG tablet Take 20 mg by mouth daily.      cholecalciferol (VITAMIN D) 1000 units tablet Take 1,000 Units by mouth daily.     co-enzyme Q-10 30 MG capsule Take 30 mg by mouth daily.      gemfibrozil (LOPID) 600 MG tablet Take 600 mg by mouth daily.     levothyroxine (SYNTHROID) 25 MCG tablet Take 25 mcg by mouth daily before breakfast.      Omega-3 Fatty Acids (FISH OIL) 1000 MG CPDR Take 1,000  mg by mouth daily.      tadalafil (CIALIS) 20 MG tablet Take 20 mg by mouth daily as needed for erectile dysfunction.      tamsulosin (FLOMAX) 0.4 MG CAPS capsule Take 0.4 mg by mouth daily.      Tiotropium Bromide Monohydrate 2.5 MCG/ACT AERS Inhale 2 puffs into the lungs daily.     No current facility-administered medications for this visit.    REVIEW OF SYSTEMS:  [X]  denotes positive finding, [ ]  denotes negative finding Cardiac  Comments:  Chest pain or chest pressure:    Shortness of breath upon exertion:    Short of breath when lying flat:    Irregular heart rhythm:        Vascular    Pain in calf, thigh, or hip brought on by ambulation:    Pain in feet at night that wakes you up from your sleep:     Blood clot in your veins:    Leg swelling:           PHYSICAL EXAM: Vitals:   10/11/20 0836  BP: (!) 144/80  Pulse: 66  Resp: 16  Temp: 98.4 F (36.9 C)  TempSrc: Temporal  SpO2: 93%  Weight: 195 lb (  88.5 kg)  Height: 6' (1.829 m)    GENERAL: The patient is a well-nourished male, in no acute distress. The vital signs are documented above. CARDIOVASCULAR: Right neck incision well-healed with no bruits bilaterally. PULMONARY: There is good air exchange  MUSCULOSKELETAL: There are no major deformities or cyanosis. NEUROLOGIC: No focal weakness or paresthesias are detected. SKIN: There are no ulcers or rashes noted. PSYCHIATRIC: The patient has a normal affect.  DATA:  Widely patent endarterectomy on the right.  Known occlusion of the left.  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  We will continue full activities.  We will see him again in 1 year with repeat carotid duplex.  He knows to report immediately to the emergency room should he develop deficits.    Rosetta Posner, MD FACS [facility] [phone]  Note: Portions of this report may have been transcribed using voice  recognition software.  Every effort has been made to ensure accuracy; however, inadvertent computerized transcription errors may still be present.

## 2020-10-14 DIAGNOSIS — J449 Chronic obstructive pulmonary disease, unspecified: Secondary | ICD-10-CM | POA: Diagnosis not present

## 2020-10-14 DIAGNOSIS — E7849 Other hyperlipidemia: Secondary | ICD-10-CM | POA: Diagnosis not present

## 2020-10-14 DIAGNOSIS — Z1211 Encounter for screening for malignant neoplasm of colon: Secondary | ICD-10-CM | POA: Diagnosis not present

## 2020-10-14 DIAGNOSIS — D126 Benign neoplasm of colon, unspecified: Secondary | ICD-10-CM | POA: Diagnosis not present

## 2020-10-14 DIAGNOSIS — K644 Residual hemorrhoidal skin tags: Secondary | ICD-10-CM | POA: Diagnosis not present

## 2020-10-14 DIAGNOSIS — N183 Chronic kidney disease, stage 3 unspecified: Secondary | ICD-10-CM | POA: Diagnosis not present

## 2020-10-14 DIAGNOSIS — E039 Hypothyroidism, unspecified: Secondary | ICD-10-CM | POA: Diagnosis not present

## 2020-10-14 DIAGNOSIS — E785 Hyperlipidemia, unspecified: Secondary | ICD-10-CM | POA: Diagnosis not present

## 2020-10-14 DIAGNOSIS — K635 Polyp of colon: Secondary | ICD-10-CM | POA: Diagnosis not present

## 2020-10-14 DIAGNOSIS — D120 Benign neoplasm of cecum: Secondary | ICD-10-CM | POA: Diagnosis not present

## 2020-10-14 DIAGNOSIS — Z79890 Hormone replacement therapy (postmenopausal): Secondary | ICD-10-CM | POA: Diagnosis not present

## 2020-10-14 DIAGNOSIS — N400 Enlarged prostate without lower urinary tract symptoms: Secondary | ICD-10-CM | POA: Diagnosis not present

## 2020-10-14 DIAGNOSIS — I129 Hypertensive chronic kidney disease with stage 1 through stage 4 chronic kidney disease, or unspecified chronic kidney disease: Secondary | ICD-10-CM | POA: Diagnosis not present

## 2020-10-14 DIAGNOSIS — D127 Benign neoplasm of rectosigmoid junction: Secondary | ICD-10-CM | POA: Diagnosis not present

## 2020-10-14 DIAGNOSIS — Z7982 Long term (current) use of aspirin: Secondary | ICD-10-CM | POA: Diagnosis not present

## 2020-10-14 DIAGNOSIS — N189 Chronic kidney disease, unspecified: Secondary | ICD-10-CM | POA: Diagnosis not present

## 2020-10-14 DIAGNOSIS — D124 Benign neoplasm of descending colon: Secondary | ICD-10-CM | POA: Diagnosis not present

## 2020-10-14 DIAGNOSIS — D125 Benign neoplasm of sigmoid colon: Secondary | ICD-10-CM | POA: Diagnosis not present

## 2020-10-14 DIAGNOSIS — D128 Benign neoplasm of rectum: Secondary | ICD-10-CM | POA: Diagnosis not present

## 2020-10-14 DIAGNOSIS — Z882 Allergy status to sulfonamides status: Secondary | ICD-10-CM | POA: Diagnosis not present

## 2020-10-14 DIAGNOSIS — D122 Benign neoplasm of ascending colon: Secondary | ICD-10-CM | POA: Diagnosis not present

## 2020-10-14 DIAGNOSIS — Z79899 Other long term (current) drug therapy: Secondary | ICD-10-CM | POA: Diagnosis not present

## 2020-10-20 DIAGNOSIS — N189 Chronic kidney disease, unspecified: Secondary | ICD-10-CM | POA: Diagnosis not present

## 2020-10-20 DIAGNOSIS — E7801 Familial hypercholesterolemia: Secondary | ICD-10-CM | POA: Diagnosis not present

## 2020-10-20 DIAGNOSIS — J449 Chronic obstructive pulmonary disease, unspecified: Secondary | ICD-10-CM | POA: Diagnosis not present

## 2020-10-20 DIAGNOSIS — E7849 Other hyperlipidemia: Secondary | ICD-10-CM | POA: Diagnosis not present

## 2020-10-20 DIAGNOSIS — I10 Essential (primary) hypertension: Secondary | ICD-10-CM | POA: Diagnosis not present

## 2020-10-20 DIAGNOSIS — R739 Hyperglycemia, unspecified: Secondary | ICD-10-CM | POA: Diagnosis not present

## 2020-10-20 DIAGNOSIS — Z23 Encounter for immunization: Secondary | ICD-10-CM | POA: Diagnosis not present

## 2020-10-20 DIAGNOSIS — Z0001 Encounter for general adult medical examination with abnormal findings: Secondary | ICD-10-CM | POA: Diagnosis not present

## 2020-11-03 DIAGNOSIS — D126 Benign neoplasm of colon, unspecified: Secondary | ICD-10-CM | POA: Diagnosis not present

## 2020-11-14 DIAGNOSIS — I129 Hypertensive chronic kidney disease with stage 1 through stage 4 chronic kidney disease, or unspecified chronic kidney disease: Secondary | ICD-10-CM | POA: Diagnosis not present

## 2020-11-14 DIAGNOSIS — N183 Chronic kidney disease, stage 3 unspecified: Secondary | ICD-10-CM | POA: Diagnosis not present

## 2020-11-14 DIAGNOSIS — E7849 Other hyperlipidemia: Secondary | ICD-10-CM | POA: Diagnosis not present

## 2021-02-04 DIAGNOSIS — M47816 Spondylosis without myelopathy or radiculopathy, lumbar region: Secondary | ICD-10-CM | POA: Diagnosis not present

## 2021-02-04 DIAGNOSIS — M9903 Segmental and somatic dysfunction of lumbar region: Secondary | ICD-10-CM | POA: Diagnosis not present

## 2021-02-07 DIAGNOSIS — M47816 Spondylosis without myelopathy or radiculopathy, lumbar region: Secondary | ICD-10-CM | POA: Diagnosis not present

## 2021-02-07 DIAGNOSIS — M9903 Segmental and somatic dysfunction of lumbar region: Secondary | ICD-10-CM | POA: Diagnosis not present

## 2021-02-08 DIAGNOSIS — M9903 Segmental and somatic dysfunction of lumbar region: Secondary | ICD-10-CM | POA: Diagnosis not present

## 2021-02-08 DIAGNOSIS — M47816 Spondylosis without myelopathy or radiculopathy, lumbar region: Secondary | ICD-10-CM | POA: Diagnosis not present

## 2021-02-09 DIAGNOSIS — M9903 Segmental and somatic dysfunction of lumbar region: Secondary | ICD-10-CM | POA: Diagnosis not present

## 2021-02-09 DIAGNOSIS — M47816 Spondylosis without myelopathy or radiculopathy, lumbar region: Secondary | ICD-10-CM | POA: Diagnosis not present

## 2021-02-10 DIAGNOSIS — M47816 Spondylosis without myelopathy or radiculopathy, lumbar region: Secondary | ICD-10-CM | POA: Diagnosis not present

## 2021-02-10 DIAGNOSIS — M9903 Segmental and somatic dysfunction of lumbar region: Secondary | ICD-10-CM | POA: Diagnosis not present

## 2021-02-11 DIAGNOSIS — M9903 Segmental and somatic dysfunction of lumbar region: Secondary | ICD-10-CM | POA: Diagnosis not present

## 2021-02-11 DIAGNOSIS — M47816 Spondylosis without myelopathy or radiculopathy, lumbar region: Secondary | ICD-10-CM | POA: Diagnosis not present

## 2021-02-15 DIAGNOSIS — M9903 Segmental and somatic dysfunction of lumbar region: Secondary | ICD-10-CM | POA: Diagnosis not present

## 2021-02-15 DIAGNOSIS — M47816 Spondylosis without myelopathy or radiculopathy, lumbar region: Secondary | ICD-10-CM | POA: Diagnosis not present

## 2021-02-23 DIAGNOSIS — L570 Actinic keratosis: Secondary | ICD-10-CM | POA: Diagnosis not present

## 2021-02-23 DIAGNOSIS — N183 Chronic kidney disease, stage 3 unspecified: Secondary | ICD-10-CM | POA: Diagnosis not present

## 2021-02-23 DIAGNOSIS — Z1283 Encounter for screening for malignant neoplasm of skin: Secondary | ICD-10-CM | POA: Diagnosis not present

## 2021-02-23 DIAGNOSIS — D239 Other benign neoplasm of skin, unspecified: Secondary | ICD-10-CM | POA: Diagnosis not present

## 2021-02-24 DIAGNOSIS — M9903 Segmental and somatic dysfunction of lumbar region: Secondary | ICD-10-CM | POA: Diagnosis not present

## 2021-02-24 DIAGNOSIS — M47816 Spondylosis without myelopathy or radiculopathy, lumbar region: Secondary | ICD-10-CM | POA: Diagnosis not present

## 2021-02-25 DIAGNOSIS — Z23 Encounter for immunization: Secondary | ICD-10-CM | POA: Diagnosis not present

## 2021-02-28 DIAGNOSIS — N2581 Secondary hyperparathyroidism of renal origin: Secondary | ICD-10-CM | POA: Diagnosis not present

## 2021-02-28 DIAGNOSIS — I129 Hypertensive chronic kidney disease with stage 1 through stage 4 chronic kidney disease, or unspecified chronic kidney disease: Secondary | ICD-10-CM | POA: Diagnosis not present

## 2021-02-28 DIAGNOSIS — N183 Chronic kidney disease, stage 3 unspecified: Secondary | ICD-10-CM | POA: Diagnosis not present

## 2021-02-28 DIAGNOSIS — D631 Anemia in chronic kidney disease: Secondary | ICD-10-CM | POA: Diagnosis not present

## 2021-03-03 DIAGNOSIS — M47816 Spondylosis without myelopathy or radiculopathy, lumbar region: Secondary | ICD-10-CM | POA: Diagnosis not present

## 2021-03-03 DIAGNOSIS — M9903 Segmental and somatic dysfunction of lumbar region: Secondary | ICD-10-CM | POA: Diagnosis not present

## 2021-03-16 DIAGNOSIS — I129 Hypertensive chronic kidney disease with stage 1 through stage 4 chronic kidney disease, or unspecified chronic kidney disease: Secondary | ICD-10-CM | POA: Diagnosis not present

## 2021-03-16 DIAGNOSIS — E7849 Other hyperlipidemia: Secondary | ICD-10-CM | POA: Diagnosis not present

## 2021-03-16 DIAGNOSIS — N183 Chronic kidney disease, stage 3 unspecified: Secondary | ICD-10-CM | POA: Diagnosis not present

## 2021-10-03 ENCOUNTER — Other Ambulatory Visit: Payer: Self-pay | Admitting: *Deleted

## 2021-10-03 DIAGNOSIS — I6529 Occlusion and stenosis of unspecified carotid artery: Secondary | ICD-10-CM

## 2021-10-12 ENCOUNTER — Ambulatory Visit: Payer: PPO | Admitting: Vascular Surgery

## 2021-11-02 ENCOUNTER — Ambulatory Visit: Payer: PPO | Admitting: Vascular Surgery

## 2021-11-02 ENCOUNTER — Ambulatory Visit (INDEPENDENT_AMBULATORY_CARE_PROVIDER_SITE_OTHER): Payer: PPO

## 2021-11-02 ENCOUNTER — Encounter: Payer: Self-pay | Admitting: Vascular Surgery

## 2021-11-02 VITALS — BP 137/74 | HR 76 | Temp 99.5°F | Resp 18 | Ht 72.0 in | Wt 198.0 lb

## 2021-11-02 DIAGNOSIS — I6529 Occlusion and stenosis of unspecified carotid artery: Secondary | ICD-10-CM | POA: Diagnosis not present

## 2021-11-02 NOTE — Progress Notes (Signed)
[facility] of [place]  Patient name: Marcus Herrera MRN: [id number] DOB: 1950-08-20 Sex: male  REASON FOR VISIT: Follow-up carotid disease  HPI: Marcus Herrera is a 71 y.o. male here today for follow-up.  He underwent right carotid endarterectomy for asymptomatic disease by myself on 12/11/2019.  He had critical right carotid stenosis and a left internal carotid artery asymptomatic occlusion.  He continues to do well.  He denies any neurologic deficits.  Past Medical History:  Diagnosis Date   Arthritis    Chronic kidney disease    COPD (chronic obstructive pulmonary disease) (HCC)    Dyspnea    on exertion at times   Hypertension    Hypothyroidism    Thyroid disease    Vertigo     History reviewed. No pertinent family history.  SOCIAL HISTORY: Social History   Tobacco Use   Smoking status: Former    Packs/day: 1.00    Years: 30.00    Total pack years: 30.00    Types: Cigarettes    Quit date: 06/19/2008    Years since quitting: 13.3   Smokeless tobacco: Never  Substance Use Topics   Alcohol use: Yes    Alcohol/week: 1.0 - 3.0 standard drink of alcohol    Types: 1 - 3 Cans of beer per week    Comment: daily     Allergies  Allergen Reactions   Sulfa Antibiotics Hives    Current Outpatient Medications  Medication Sig Dispense Refill   albuterol (PROVENTIL HFA;VENTOLIN HFA) 108 (90 Base) MCG/ACT inhaler Inhale 2 puffs into the lungs every 4 (four) hours as needed for wheezing or shortness of breath.     aspirin 81 MG tablet Take 81 mg by mouth daily.     atorvastatin (LIPITOR) 20 MG tablet Take 20 mg by mouth daily.      cholecalciferol (VITAMIN D) 1000 units tablet Take 1,000 Units by mouth daily.     co-enzyme Q-10 30 MG capsule Take 30 mg by mouth daily.      gemfibrozil (LOPID) 600 MG tablet Take 600 mg by mouth daily.     levothyroxine (SYNTHROID) 25 MCG tablet Take 25 mcg by mouth daily before breakfast.       Omega-3 Fatty Acids (FISH OIL) 1000 MG CPDR Take 1,000 mg by mouth daily.      tadalafil (CIALIS) 20 MG tablet Take 20 mg by mouth daily as needed for erectile dysfunction.      tamsulosin (FLOMAX) 0.4 MG CAPS capsule Take 0.4 mg by mouth daily.      Tiotropium Bromide Monohydrate 2.5 MCG/ACT AERS Inhale 2 puffs into the lungs daily.     No current facility-administered medications for this visit.    REVIEW OF SYSTEMS:  '[X]'$  denotes positive finding, '[ ]'$  denotes negative finding Cardiac  Comments:  Chest pain or chest pressure:    Shortness of breath upon exertion:    Short of breath when lying flat:    Irregular heart rhythm:        Vascular    Pain in calf, thigh, or hip brought on by ambulation:    Pain in feet at night that wakes you up from your sleep:     Blood clot in your veins:    Leg swelling:           PHYSICAL EXAM: Vitals:   11/02/21 1423 11/02/21 1424  BP: 135/74 137/74  Pulse: 76   Resp: 18   Temp: 99.5 F (37.5  C)   TempSrc: Temporal   SpO2: 95%   Weight: 198 lb (89.8 kg)   Height: 6' (1.829 m)     GENERAL: The patient is a well-nourished male, in no acute distress. The vital signs are documented above. CARDIOVASCULAR: Well-healed right carotid incision with no bruits bilaterally.  2+ radial pulses bilaterally. PULMONARY: There is good air exchange  MUSCULOSKELETAL: There are no major deformities or cyanosis. NEUROLOGIC: No focal weakness or paresthesias are detected. SKIN: There are no ulcers or rashes noted. PSYCHIATRIC: The patient has a normal affect.  DATA:  He underwent carotid duplex today in our office and I reviewed this with him.  This shows widely patent endarterectomy on the right with no evidence of recurrent stenosis.  His left internal carotid is occluded  MEDICAL ISSUES: Stable overall.  I again reviewed symptoms of carotid disease with the patient.  He will present to the emergency room immediately should this occur.  Otherwise we will  see him in 1 year with repeat carotid duplex follow-up    Rosetta Posner, MD FACS [facility] [phone]  Note: Portions of this report may have been transcribed using voice recognition software.  Every effort has been made to ensure accuracy; however, inadvertent computerized transcription errors may still be present.

## 2023-02-19 ENCOUNTER — Encounter: Payer: Self-pay | Admitting: Vascular Surgery

## 2023-04-19 DIAGNOSIS — E559 Vitamin D deficiency, unspecified: Secondary | ICD-10-CM | POA: Diagnosis not present

## 2023-04-19 DIAGNOSIS — E039 Hypothyroidism, unspecified: Secondary | ICD-10-CM | POA: Diagnosis not present

## 2023-04-19 DIAGNOSIS — E7801 Familial hypercholesterolemia: Secondary | ICD-10-CM | POA: Diagnosis not present

## 2023-04-19 DIAGNOSIS — E7849 Other hyperlipidemia: Secondary | ICD-10-CM | POA: Diagnosis not present

## 2023-04-19 DIAGNOSIS — N185 Chronic kidney disease, stage 5: Secondary | ICD-10-CM | POA: Diagnosis not present

## 2023-04-19 DIAGNOSIS — R7303 Prediabetes: Secondary | ICD-10-CM | POA: Diagnosis not present

## 2023-04-24 DIAGNOSIS — G5600 Carpal tunnel syndrome, unspecified upper limb: Secondary | ICD-10-CM | POA: Diagnosis not present

## 2023-04-24 DIAGNOSIS — Z23 Encounter for immunization: Secondary | ICD-10-CM | POA: Diagnosis not present

## 2023-04-24 DIAGNOSIS — I6529 Occlusion and stenosis of unspecified carotid artery: Secondary | ICD-10-CM | POA: Diagnosis not present

## 2023-04-24 DIAGNOSIS — E7849 Other hyperlipidemia: Secondary | ICD-10-CM | POA: Diagnosis not present

## 2023-04-24 DIAGNOSIS — E039 Hypothyroidism, unspecified: Secondary | ICD-10-CM | POA: Diagnosis not present

## 2023-04-24 DIAGNOSIS — R739 Hyperglycemia, unspecified: Secondary | ICD-10-CM | POA: Diagnosis not present

## 2023-04-24 DIAGNOSIS — J449 Chronic obstructive pulmonary disease, unspecified: Secondary | ICD-10-CM | POA: Diagnosis not present

## 2023-04-24 DIAGNOSIS — Z6827 Body mass index (BMI) 27.0-27.9, adult: Secondary | ICD-10-CM | POA: Diagnosis not present

## 2023-04-24 DIAGNOSIS — I10 Essential (primary) hypertension: Secondary | ICD-10-CM | POA: Diagnosis not present

## 2023-04-30 DIAGNOSIS — H25813 Combined forms of age-related cataract, bilateral: Secondary | ICD-10-CM | POA: Diagnosis not present

## 2023-10-29 DIAGNOSIS — E039 Hypothyroidism, unspecified: Secondary | ICD-10-CM | POA: Diagnosis not present

## 2023-10-29 DIAGNOSIS — Z131 Encounter for screening for diabetes mellitus: Secondary | ICD-10-CM | POA: Diagnosis not present

## 2023-10-29 DIAGNOSIS — Z0001 Encounter for general adult medical examination with abnormal findings: Secondary | ICD-10-CM | POA: Diagnosis not present

## 2023-10-29 DIAGNOSIS — E7849 Other hyperlipidemia: Secondary | ICD-10-CM | POA: Diagnosis not present

## 2023-10-29 DIAGNOSIS — R5383 Other fatigue: Secondary | ICD-10-CM | POA: Diagnosis not present

## 2023-10-29 DIAGNOSIS — D559 Anemia due to enzyme disorder, unspecified: Secondary | ICD-10-CM | POA: Diagnosis not present

## 2023-10-29 DIAGNOSIS — E559 Vitamin D deficiency, unspecified: Secondary | ICD-10-CM | POA: Diagnosis not present

## 2023-11-05 DIAGNOSIS — R5383 Other fatigue: Secondary | ICD-10-CM | POA: Diagnosis not present

## 2023-11-05 DIAGNOSIS — E039 Hypothyroidism, unspecified: Secondary | ICD-10-CM | POA: Diagnosis not present

## 2023-11-05 DIAGNOSIS — E782 Mixed hyperlipidemia: Secondary | ICD-10-CM | POA: Diagnosis not present

## 2023-11-05 DIAGNOSIS — Z6827 Body mass index (BMI) 27.0-27.9, adult: Secondary | ICD-10-CM | POA: Diagnosis not present

## 2023-11-05 DIAGNOSIS — I6529 Occlusion and stenosis of unspecified carotid artery: Secondary | ICD-10-CM | POA: Diagnosis not present

## 2023-11-05 DIAGNOSIS — E7849 Other hyperlipidemia: Secondary | ICD-10-CM | POA: Diagnosis not present

## 2023-11-05 DIAGNOSIS — N185 Chronic kidney disease, stage 5: Secondary | ICD-10-CM | POA: Diagnosis not present

## 2023-11-13 DIAGNOSIS — Z122 Encounter for screening for malignant neoplasm of respiratory organs: Secondary | ICD-10-CM | POA: Diagnosis not present

## 2023-11-13 DIAGNOSIS — Z87891 Personal history of nicotine dependence: Secondary | ICD-10-CM | POA: Diagnosis not present

## 2023-11-13 DIAGNOSIS — F17210 Nicotine dependence, cigarettes, uncomplicated: Secondary | ICD-10-CM | POA: Diagnosis not present

## 2024-01-29 DIAGNOSIS — Z23 Encounter for immunization: Secondary | ICD-10-CM | POA: Diagnosis not present

## 2024-03-03 DIAGNOSIS — L821 Other seborrheic keratosis: Secondary | ICD-10-CM | POA: Diagnosis not present

## 2024-03-03 DIAGNOSIS — I781 Nevus, non-neoplastic: Secondary | ICD-10-CM | POA: Diagnosis not present

## 2024-03-03 DIAGNOSIS — L573 Poikiloderma of Civatte: Secondary | ICD-10-CM | POA: Diagnosis not present

## 2024-03-03 DIAGNOSIS — L570 Actinic keratosis: Secondary | ICD-10-CM | POA: Diagnosis not present

## 2024-03-10 DIAGNOSIS — N183 Chronic kidney disease, stage 3 unspecified: Secondary | ICD-10-CM | POA: Diagnosis not present

## 2024-03-17 DIAGNOSIS — N1831 Chronic kidney disease, stage 3a: Secondary | ICD-10-CM | POA: Diagnosis not present

## 2024-03-17 DIAGNOSIS — N2581 Secondary hyperparathyroidism of renal origin: Secondary | ICD-10-CM | POA: Diagnosis not present

## 2024-03-17 DIAGNOSIS — D631 Anemia in chronic kidney disease: Secondary | ICD-10-CM | POA: Diagnosis not present

## 2024-03-17 DIAGNOSIS — I129 Hypertensive chronic kidney disease with stage 1 through stage 4 chronic kidney disease, or unspecified chronic kidney disease: Secondary | ICD-10-CM | POA: Diagnosis not present
